# Patient Record
Sex: Female | Born: 1985 | Race: White | Hispanic: No | Marital: Single | Smoking: Current every day smoker
Health system: Southern US, Community
[De-identification: ages and names within clinical notes are randomized; demographics above are authoritative.]

## PROBLEM LIST (undated history)

## (undated) DIAGNOSIS — O1490 Unspecified pre-eclampsia, unspecified trimester: Secondary | ICD-10-CM

---
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
## 2005-10-27 ENCOUNTER — Emergency Department (HOSPITAL_COMMUNITY): Admission: EM | Admit: 2005-10-27 | Discharge: 2005-10-28 | Payer: Self-pay | Admitting: Emergency Medicine

## 2005-10-27 ENCOUNTER — Emergency Department (HOSPITAL_COMMUNITY): Admission: EM | Admit: 2005-10-27 | Discharge: 2005-10-27 | Payer: Self-pay | Admitting: Emergency Medicine

## 2006-02-04 ENCOUNTER — Emergency Department (HOSPITAL_COMMUNITY): Admission: EM | Admit: 2006-02-04 | Discharge: 2006-02-04 | Payer: Self-pay | Admitting: Emergency Medicine

## 2006-10-03 ENCOUNTER — Emergency Department (HOSPITAL_COMMUNITY): Admission: EM | Admit: 2006-10-03 | Discharge: 2006-10-03 | Payer: Self-pay | Admitting: Emergency Medicine

## 2007-03-31 ENCOUNTER — Emergency Department (HOSPITAL_COMMUNITY): Admission: EM | Admit: 2007-03-31 | Discharge: 2007-03-31 | Payer: Self-pay | Admitting: Family Medicine

## 2007-09-29 ENCOUNTER — Emergency Department (HOSPITAL_COMMUNITY): Admission: EM | Admit: 2007-09-29 | Discharge: 2007-09-30 | Payer: Self-pay | Admitting: Emergency Medicine

## 2007-11-16 ENCOUNTER — Inpatient Hospital Stay (HOSPITAL_COMMUNITY): Admission: AD | Admit: 2007-11-16 | Discharge: 2007-11-17 | Payer: Self-pay | Admitting: Obstetrics

## 2008-05-21 ENCOUNTER — Inpatient Hospital Stay (HOSPITAL_COMMUNITY): Admission: AD | Admit: 2008-05-21 | Discharge: 2008-05-23 | Payer: Self-pay | Admitting: Obstetrics

## 2008-07-30 ENCOUNTER — Emergency Department (HOSPITAL_COMMUNITY): Admission: EM | Admit: 2008-07-30 | Discharge: 2008-07-31 | Payer: Self-pay | Admitting: Emergency Medicine

## 2009-05-08 ENCOUNTER — Emergency Department (HOSPITAL_COMMUNITY): Admission: EM | Admit: 2009-05-08 | Discharge: 2009-05-08 | Payer: Self-pay | Admitting: Emergency Medicine

## 2010-02-09 ENCOUNTER — Emergency Department (HOSPITAL_COMMUNITY): Admission: EM | Admit: 2010-02-09 | Discharge: 2010-02-10 | Payer: Self-pay | Admitting: Emergency Medicine

## 2011-04-15 NOTE — Discharge Summary (Signed)
NAME:  Lacey Ford, Lacey Ford                          ACCOUNT NO.:  [id number]   MEDICAL RECORD NO.:  [id number]                   PATIENT TYPE:  INP   LOCATION:  9148                                 FACILITY:  [facility]   PHYSICIAN:  Gennie Alma, CNM                DATE OF BIRTH:  1986/04/23   DATE OF ADMISSION:  09/25/2003  DATE OF DISCHARGE:  09/29/2003                                 DISCHARGE SUMMARY   HISTORY OF PRESENT ILLNESS:  The patient is a 25 year old gravida 3, para 0-  0-2-0 when she presented and after discharge she was a gravida 3, para 1-0-2-  1.  She presented at 39 weeks for induction of labor due to size less than  dates and ultrasound showing IUGR.  She was admitted for Pitocin induction.  She had three postpartum hospital days.  She delivered a viable female by  vacuum extraction for persistent second stage variable decelerations.  Baby  had a compound hand presentation and nuchal cord x1, body cord x1, leg cord  x1.  Apgars were 8/9.  She had no lacerations.  Her estimated blood loss was  less than 100 mL.  She had an uncomplicated postpartum stay.  She was  afebrile.  Fundus was firm.  She received Depo-Provera 150 mg IM on day of  discharge.  She was discharged on September 29, 2003 without any complications  or problems and was given her Depo-Provera before she left.  Her blood  pressures were normal throughout her hospital stay.  She had a slightly  elevated blood pressure prior to her induction.                                               Gennie Alma, CNM    ACC/MEDQ  D:  12/03/2003  T:  12/03/2003  Job:  [id number]

## 2011-05-07 ENCOUNTER — Emergency Department (HOSPITAL_COMMUNITY)
Admission: EM | Admit: 2011-05-07 | Discharge: 2011-05-08 | Disposition: A | Discharge status: Home / Self Care | Payer: Self-pay | Attending: Emergency Medicine | Admitting: Emergency Medicine

## 2011-05-07 DIAGNOSIS — H5710 Ocular pain, unspecified eye: Secondary | ICD-10-CM | POA: Insufficient documentation

## 2011-05-07 DIAGNOSIS — S0083XA Contusion of other part of head, initial encounter: Secondary | ICD-10-CM | POA: Insufficient documentation

## 2011-05-07 DIAGNOSIS — S0003XA Contusion of scalp, initial encounter: Secondary | ICD-10-CM | POA: Insufficient documentation

## 2011-05-07 DIAGNOSIS — S0990XA Unspecified injury of head, initial encounter: Secondary | ICD-10-CM | POA: Insufficient documentation

## 2011-05-07 DIAGNOSIS — R51 Headache: Secondary | ICD-10-CM | POA: Insufficient documentation

## 2011-05-07 DIAGNOSIS — F988 Other specified behavioral and emotional disorders with onset usually occurring in childhood and adolescence: Secondary | ICD-10-CM | POA: Insufficient documentation

## 2011-05-07 DIAGNOSIS — F319 Bipolar disorder, unspecified: Secondary | ICD-10-CM | POA: Insufficient documentation

## 2011-05-08 ENCOUNTER — Emergency Department (HOSPITAL_COMMUNITY): Payer: Self-pay

## 2011-08-25 LAB — CBC
HCT: 36.5
Hemoglobin: 11 — ABNORMAL LOW
MCHC: 35.2
MCV: 92.8
MCV: 94
Platelets: 183
RDW: 12.4
RDW: 12.9

## 2011-09-02 LAB — URINALYSIS, ROUTINE W REFLEX MICROSCOPIC
Glucose, UA: NEGATIVE
Ketones, ur: NEGATIVE
Ketones, ur: NEGATIVE
Nitrite: NEGATIVE
Nitrite: NEGATIVE
Protein, ur: NEGATIVE
Specific Gravity, Urine: 1.015
Specific Gravity, Urine: 1.025

## 2011-09-02 LAB — GC/CHLAMYDIA PROBE AMP, GENITAL: Chlamydia, DNA Probe: POSITIVE — AB

## 2011-09-02 LAB — URINE MICROSCOPIC-ADD ON

## 2011-09-06 LAB — POCT PREGNANCY, URINE
Operator id: 294511
Preg Test, Ur: POSITIVE

## 2011-09-06 LAB — STREP A DNA PROBE: Group A Strep Probe: NEGATIVE

## 2012-07-19 ENCOUNTER — Emergency Department (HOSPITAL_COMMUNITY)
Admission: EM | Admit: 2012-07-19 | Discharge: 2012-07-19 | Disposition: A | Discharge status: Home / Self Care | Payer: Self-pay | Attending: Emergency Medicine | Admitting: Emergency Medicine

## 2012-07-19 ENCOUNTER — Encounter (HOSPITAL_COMMUNITY): Payer: Self-pay | Admitting: Emergency Medicine

## 2012-07-19 DIAGNOSIS — K0889 Other specified disorders of teeth and supporting structures: Secondary | ICD-10-CM

## 2012-07-19 DIAGNOSIS — K089 Disorder of teeth and supporting structures, unspecified: Secondary | ICD-10-CM | POA: Insufficient documentation

## 2012-07-19 MED ORDER — OXYCODONE-ACETAMINOPHEN 5-325 MG PO TABS: 1.0000 | ORAL_TABLET | Freq: Four times a day (QID) | ORAL | Status: AC | PRN

## 2012-07-19 MED ORDER — PENICILLIN V POTASSIUM 500 MG PO TABS: 500.0000 mg | ORAL_TABLET | Freq: Three times a day (TID) | ORAL | Status: AC

## 2012-07-19 NOTE — ED Provider Notes (Signed)
History     CSN: [id number]  Arrival date & time 07/19/12  1220   First MD Initiated Contact with Patient 07/19/12 1259      Chief Complaint  Patient presents with  . Dental Pain    (Consider location/radiation/quality/duration/timing/severity/associated sxs/prior treatment) HPI Pt presents with pain in right lower premolar.  Felt pain approx 6 weeks ago and noted tooth to be decayed and broken, then felt better until 3-4 days ago pain recurred.  No fever.  No facial swelling.  No difficulty breathing or swallowing.  Took ibuprofen, but none today, states she did not get any pain relief from this.  Has not seen a dentist.  There are no other associated systemic symptoms, there are no other alleviating or modifying factors.   History reviewed. No pertinent past medical history.  History reviewed. No pertinent past surgical history.  History reviewed. No pertinent family history.  History  Substance Use Topics  . Smoking status: Not on file  . Smokeless tobacco: Not on file  . Alcohol Use: Not on file    OB History    Grav Para Term Preterm Abortions TAB SAB Ect Mult Living                  Review of Systems ROS reviewed and all otherwise negative except for mentioned in HPI  Allergies  Review of patient's allergies indicates no known allergies.  Home Medications   Current Outpatient Rx  Name Route Sig Dispense Refill  . GOODY HEADACHE PO Oral Take 1 packet by mouth daily as needed. For headache    . IBUPROFEN 200 MG PO TABS Oral Take 400 mg by mouth every 6 (six) hours as needed. For pain    . OXYCODONE-ACETAMINOPHEN 5-325 MG PO TABS Oral Take 1-2 tablets by mouth every 6 (six) hours as needed for pain. 15 tablet 0  . PENICILLIN V POTASSIUM 500 MG PO TABS Oral Take 1 tablet (500 mg total) by mouth 3 (three) times daily. 30 tablet 0    BP 128/86  Pulse 120  Temp 97.8 F (36.6 C) (Oral)  Resp 22  Wt 120 lb (54.432 kg)  SpO2 100% Vitals reviewed Physical  Exam Physical Examination: General appearance - alert, well appearing, and in no distress Mental status - alert, oriented to person, place, and time Eyes - no conjunctival injection Mouth - mucous membranes moist, pharynx normal without lesions, decay/erosion of right lower molar, no periapical or gingival abscess Neck - supple, no significant adenopathy Chest - clear to auscultation, no wheezes, rales or rhonchi, symmetric air entry Heart - normal rate, regular rhythm, normal S1, S2, no murmurs, rubs, clicks or gallops Abdomen - soft, nontender, nondistended, no masses or organomegaly Extremities - peripheral pulses normal, no pedal edema, no clubbing or cyanosis Skin - normal coloration and turgor, no rashes  ED Course  Procedures (including critical care time)  Labs Reviewed - No data to display No results found.   1. Pain, dental       MDM  Pt presents with c/o dental pain.  No evidence of abscess.  Pt started on antibiotics and pain medication.  Given information for dental followup.  Discharged with strict return precautions.  Pt agreeable with plan.        Ethelda Chick, MD 07/21/12 [phone]

## 2012-07-19 NOTE — ED Notes (Signed)
Has had lower right jaw pain x 6 months. Has not been seen for this. Last took ibuprofen 2 days ago but "it didn't help."

## 2014-05-06 ENCOUNTER — Emergency Department (HOSPITAL_COMMUNITY)
Admission: EM | Admit: 2014-05-06 | Discharge: 2014-05-06 | Disposition: A | Discharge status: Home / Self Care | Payer: Self-pay | Attending: Emergency Medicine | Admitting: Emergency Medicine

## 2014-05-06 ENCOUNTER — Encounter (HOSPITAL_COMMUNITY): Payer: Self-pay | Admitting: Emergency Medicine

## 2014-05-06 DIAGNOSIS — K089 Disorder of teeth and supporting structures, unspecified: Secondary | ICD-10-CM | POA: Insufficient documentation

## 2014-05-06 DIAGNOSIS — K029 Dental caries, unspecified: Secondary | ICD-10-CM | POA: Insufficient documentation

## 2014-05-06 DIAGNOSIS — F17200 Nicotine dependence, unspecified, uncomplicated: Secondary | ICD-10-CM | POA: Insufficient documentation

## 2014-05-06 DIAGNOSIS — Z792 Long term (current) use of antibiotics: Secondary | ICD-10-CM | POA: Insufficient documentation

## 2014-05-06 DIAGNOSIS — K0889 Other specified disorders of teeth and supporting structures: Secondary | ICD-10-CM

## 2014-05-06 MED ORDER — HYDROCODONE-ACETAMINOPHEN 5-325 MG PO TABS: 1.0000 | ORAL_TABLET | Freq: Four times a day (QID) | ORAL | Status: DC | PRN

## 2014-05-06 MED ORDER — PENICILLIN V POTASSIUM 500 MG PO TABS: 500.0000 mg | ORAL_TABLET | Freq: Four times a day (QID) | ORAL | Status: DC

## 2014-05-06 NOTE — ED Notes (Signed)
Patient reports she had [employer] roast beef sandwich, fries, and mozzarella sticks 4 hours ago.

## 2014-05-06 NOTE — ED Provider Notes (Signed)
Medical screening examination/treatment/procedure(s) were performed by non-physician practitioner and as supervising physician I was immediately available for consultation/collaboration.   EKG Interpretation None       Doug Sou, MD 05/06/14 [phone]

## 2014-05-06 NOTE — ED Provider Notes (Signed)
CSN: [id number]     Arrival date & time 05/06/14  0204 History   First MD Initiated Contact with Patient 05/06/14 0229     Chief Complaint  Patient presents with  . Dental Pain     (Consider location/radiation/quality/duration/timing/severity/associated sxs/prior Treatment) Patient is a 28 y.o. female presenting with tooth pain. The history is provided by the patient. No language interpreter was used.  Dental Pain Location:  Upper Upper teeth location:  1/RU 3rd molar Quality:  Throbbing and aching Severity:  Moderate Onset quality:  Gradual Duration:  1 week Timing:  Constant Progression:  Worsening Chronicity:  Recurrent Context: dental caries and poor dentition   Context: not trauma   Relieved by:  Nothing Ineffective treatments: BC powders and tylenol. Associated symptoms: no difficulty swallowing, no drooling, no fever, no neck pain, no neck swelling, no oral bleeding, no oral lesions and no trismus   Risk factors: lack of dental care, periodontal disease and smoking     History reviewed. No pertinent past medical history. History reviewed. No pertinent past surgical history. No family history on file. History  Substance Use Topics  . Smoking status: Current Every Day Smoker -- 0.50 packs/day    Types: Cigarettes  . Smokeless tobacco: Not on file  . Alcohol Use: Yes     Comment: Occasional   OB History   Grav Para Term Preterm Abortions TAB SAB Ect Mult Living                  Review of Systems  Constitutional: Negative for fever.  HENT: Positive for dental problem. Negative for drooling, mouth sores and trouble swallowing.   Respiratory: Negative for shortness of breath.   Musculoskeletal: Negative for neck pain.  All other systems reviewed and are negative.    Allergies  Review of patient's allergies indicates no known allergies.  Home Medications   Prior to Admission medications   Medication Sig Start Date End Date Taking? Authorizing Provider    Aspirin-Caffeine 845-65 MG PACK Take 1 Package by mouth every 6 (six) hours as needed (for pain).   Yes Historical Provider, MD  ibuprofen (ADVIL,MOTRIN) 200 MG tablet Take 800 mg by mouth every 6 (six) hours as needed for moderate pain. For pain   Yes Historical Provider, MD  HYDROcodone-acetaminophen (NORCO/VICODIN) 5-325 MG per tablet Take 1-2 tablets by mouth every 6 (six) hours as needed for moderate pain or severe pain. 05/06/14   Antony Madura, PA-C  penicillin v potassium (VEETID) 500 MG tablet Take 1 tablet (500 mg total) by mouth 4 (four) times daily. 05/06/14   Antony Madura, PA-C   BP 114/62  Pulse 64  Temp(Src) 98.1 F (36.7 C) (Oral)  Resp 18  Ht 5\' 5"  (1.651 m)  Wt 120 lb (54.432 kg)  BMI 19.97 kg/m2  SpO2 97%  LMP 05/04/2014  Physical Exam  Nursing note and vitals reviewed. Constitutional: She is oriented to person, place, and time. She appears well-developed and well-nourished. No distress.  Nontoxic/nonseptic appearing  HENT:  Head: Normocephalic and atraumatic.  Mouth/Throat: Uvula is midline, oropharynx is clear and moist and mucous membranes are normal. No trismus in the jaw. Abnormal dentition. Dental caries present. No uvula swelling.    Uvula midline. Patient tolerating secretions without difficulty.  Eyes: Conjunctivae and EOM are normal. No scleral icterus.  Neck: Normal range of motion.  Neck supple. No stridor.  Pulmonary/Chest: Effort normal. No respiratory distress.  Musculoskeletal: Normal range of motion.  Neurological: She is alert and  oriented to person, place, and time.  Skin: Skin is warm and dry. No rash noted. She is not diaphoretic. No erythema. No pallor.  Psychiatric: She has a normal mood and affect. Her behavior is normal.    ED Course  Dental Date/Time: 05/06/2014 3:25 AM Performed by: Antony MaduraHUMES, Karis Rilling Authorized by: Antony MaduraHUMES, Loree Shehata Consent: Verbal consent obtained. written consent not obtained. The procedure was performed in an emergent  situation. Risks and benefits: risks, benefits and alternatives were discussed Consent given by: patient Patient understanding: patient states understanding of the procedure being performed Patient consent: the patient's understanding of the procedure matches consent given Procedure consent: procedure consent matches procedure scheduled Relevant documents: relevant documents present and verified Test results: test results available and properly labeled Site marked: the operative site was marked Imaging studies: imaging studies available Required items: required blood products, implants, devices, and special equipment available Patient identity confirmed: verbally with patient and arm band Time out: Immediately prior to procedure a "time out" was called to verify the correct patient, procedure, equipment, support staff and site/side marked as required. Preparation: Patient was prepped and draped in the usual sterile fashion. Local anesthesia used: yes Anesthesia: local infiltration Local anesthetic: bupivacaine 0.5% with epinephrine Anesthetic total: 1.8 ml Patient sedated: no Patient tolerance: Patient tolerated the procedure well with no immediate complications.   (including critical care time) Labs Review Labs Reviewed - No data to display  Imaging Review No results found.   EKG Interpretation None      MDM   Final diagnoses:  Dentalgia    Patient with toothache x 1 week. No gross abscess. Exam not concerning for Ludwig's angina or spread of infection. Will treat with penicillin and pain medicine. Urged patient to follow-up with dentist. Referral and resource guide provided. Patient agreeable to plan with no unaddressed concerns.   Filed Vitals:   05/06/14 0210 05/06/14 0229 05/06/14 0245 05/06/14 0315  BP: 128/82 128/82 114/62 138/71  Pulse: 79 71 64 75  Temp: 98.1 F (36.7 C)     TempSrc: Oral     Resp: 16 18    Height: 5\' 5"  (1.651 m)     Weight: 120 lb (54.432  kg)     SpO2: 98% 98% 97% 99%        Antony MaduraKelly Vernette Moise, PA-C 05/06/14 [phone]

## 2014-05-06 NOTE — ED Notes (Signed)
Preparing patient for discharge. 

## 2014-05-06 NOTE — ED Notes (Signed)
Patient here with complaint of upper right jaw pain starting about a week ago. Patient states that pain was initially intermittent but has become constant and more diffuse at this time.

## 2014-05-06 NOTE — ED Notes (Signed)
Dental box at the bedside. 

## 2014-05-06 NOTE — ED Notes (Signed)
Patient reports she has taken [facility] powders and motrin for pain management with no relief.

## 2014-05-06 NOTE — ED Notes (Signed)
Kelly, PA-C, at the bedside.  

## 2014-05-06 NOTE — Discharge Instructions (Signed)
Dental Pain °A tooth ache may be caused by cavities (tooth decay). Cavities expose the nerve of the tooth to air and hot or cold temperatures. It may come from an infection or abscess (also called a boil or furuncle) around your tooth. It is also often caused by dental caries (tooth decay). This causes the pain you are having. °DIAGNOSIS  °Your caregiver can diagnose this problem by exam. °TREATMENT  °· If caused by an infection, it may be treated with medications which kill germs (antibiotics) and pain medications as prescribed by your caregiver. Take medications as directed. °· Only take over-the-counter or prescription medicines for pain, discomfort, or fever as directed by your caregiver. °· Whether the tooth ache today is caused by infection or dental disease, you should see your dentist as soon as possible for further care. °SEEK MEDICAL CARE IF: °The exam and treatment you received today has been provided on an emergency basis only. This is not a substitute for complete medical or dental care. If your problem worsens or new problems (symptoms) appear, and you are unable to meet with your dentist, call or return to this location. °SEEK IMMEDIATE MEDICAL CARE IF:  °· You have a fever. °· You develop redness and swelling of your face, jaw, or neck. °· You are unable to open your mouth. °· You have severe pain uncontrolled by pain medicine. °MAKE SURE YOU:  °· Understand these instructions. °· Will watch your condition. °· Will get help right away if you are not doing well or get worse. °Document Released: 11/14/2005 Document Revised: 02/06/2012 Document Reviewed: 07/02/2008 °ExitCare® Patient Information ©2014 [employer]. °  Emergency Department Resource Guide °1) Find a Doctor and Pay Out of Pocket °Although you won't have to find out who is covered by your insurance plan, it is a good idea to ask around and get recommendations. You will then need to call the office and see if the doctor you have chosen will  accept you as a new patient and what types of options they offer for patients who are self-pay. Some doctors offer discounts or will set up payment plans for their patients who do not have insurance, but you will need to ask so you aren't surprised when you get to your appointment. ° °2) Contact Your Local Health Department °Not all health departments have doctors that can see patients for sick visits, but many do, so it is worth a call to see if yours does. If you don't know where your local health department is, you can check in your phone book. The CDC also has a tool to help you locate your state's health department, and many state websites also have listings of all of their local health departments. ° °3) Find a Walk-in Clinic °If your illness is not likely to be very severe or complicated, you may want to try a walk in clinic. These are popping up all over the country in pharmacies, drugstores, and shopping centers. They're usually staffed by nurse practitioners or physician assistants that have been trained to treat common illnesses and complaints. They're usually fairly quick and inexpensive. However, if you have serious medical issues or chronic medical problems, these are probably not your best option. ° °No Primary Care Doctor: °- Call [facility] at  832-8000 - they can help you locate a primary care doctor that  accepts your insurance, provides certain services, etc. °- Physician Referral Service- 1-800-533-3463 ° °Chronic Pain Problems: °Organization           Address  Phone   Notes  °[facility]  (336) 297-2271 Patients need to be referred by their primary care doctor.  ° °Medication Assistance: °Organization         Address  Phone   Notes  °[place] [facility] [address] °[place], [place] [address] (336) 641-8030 --Must be a resident of [place] °-- Must have NO insurance coverage whatsoever (no Medicaid/ Medicare, etc.) °-- The pt.  MUST have a primary care doctor that directs their care regularly and follows them in the community °  °[facility]  (866) 331-1348   °[employer]  (888) 892-1162   ° °Agencies that provide inexpensive medical care: °Organization         Address  Phone   Notes  °[facility]  (336) 832-8035   °[facility]    (336) 832-7272   °[facility] [address] °[place], [place] [address] (336) 832-4777   °[facility] [address], °[place] (336) 271-4999   °[facility]    (336) 373-0678   °[facility]    (336) 272-1050   °[facility] ° [address], [place] Phone:  (336) 832-4444, Fax:  (336) 832-4440 Hours of Operation:  9 am - 6 pm, M-F.  Also accepts Medicaid/Medicare and self-pay.  °[facility] ° [address], [place] Phone: (336) 832-3150, Fax: (336) 832-3151. Hours of Operation:  8:30 am - 5:30 pm, M-F.  Also accepts Medicaid and self-pay.  °[facility] [place] [address], [place] Phone: (336) 878-6027   °[facility] [address], [place], [place] (336)723-1848, Ext. 123 Mondays & Thursdays: 7-9 AM.  First 15 patients are seen on a first come, first serve basis. °  ° °Medicaid-accepting [place] Providers: ° °Organization         Address  Phone   Notes  °[facility] [address], [place] (336) 641-2100 Also accepts self-pay patients.  °[facility] [address], [place] ° (336) 856-9996   °[facility] [address], [place] (336) 288-8857   °[facility] [address] [place] [address], [place] (336) 299-7000   °Veita Bland [address], [place]  ° (336) 373-1557 Only accepts [place] Access Medicaid patients after they have their name applied to their card.  ° °Self-Pay (no insurance) in  [place]: ° °Organization         Address  Phone   Notes  °Sickle Cell Patients, [facility] [address], [place] (336) 832-1970   °[facility] [address], [place] (336) 832-4400   °[facility] ° [address], [place] (336) 992-4800   °[facility]/Dr. Osei-Bonsu ° [address] [place] [address], [place] or [address], [place] (336) 841-8500 Phone number for both [place] and [place] locations is the same.  °[facility] [address], [place] (336) 299-0000   °[facility] [address] [place] [address], [place] or [address] (336) 852-7530 °(336) 878-2260   °[facility] [address], [place] (336) 350-1642, phone; (336) 294-5005, fax Sees patients 1st and 3rd Saturday of every month.  Must not qualify   for public or private insurance (i.e. Medicaid, Medicare, [place] Health Choice, Veterans' Benefits) • Household income should be no more than 200% of the poverty level •The clinic cannot treat you if you are pregnant or think you are pregnant • Sexually transmitted diseases are not treated at the clinic.  ° °Dental Care: °Organization         Address  Phone  Notes  °[facility] [address], [place] (336) 641-6152 Accepts children up to age 21 who are enrolled in Medicaid or [place] Health Choice; pregnant women with a Medicaid card; and children who have applied for Medicaid or [place] Health Choice, but were declined, whose parents can pay a reduced fee at time of service.  °[facility] [place]  [address], [place] (336) 641-7733 Accepts children up to age 21 who are enrolled in Medicaid or [place] Health Choice; pregnant women with a Medicaid card; and children who have applied for Medicaid or [place] Health Choice, but were declined, whose parents  can pay a reduced fee at time of service.  °[facility] ° [address], [place] (336) 641-4533 Patients are seen by appointment only. Walk-ins are not accepted. [facility] will see patients 18 years of age and older. °Monday - Tuesday (8am-5pm) °Most Wednesdays (8:30-5pm) °$30 per visit, cash only  °[facility] ° [address], [place] (336) 641-4533 Patients are seen by appointment only. Walk-ins are not accepted. [facility] will see patients 18 years of age and older. °One Wednesday Evening (Monthly: Volunteer Based).  $30 per visit, cash only  °[facility]  (919) 537-3737 for adults; Children under age 4, call Graduate Pediatric Dentistry at (919) [phone]. Children aged 4-14, please call (919) [phone] to request a pediatric application. ° Dental services are provided in all areas of dental care including fillings, crowns and bridges, complete and partial dentures, implants, gum treatment, root canals, and extractions. Preventive care is also provided. Treatment is provided to both adults and children. °Patients are selected via a lottery and there is often a waiting list. °  °[facility] [address], °[place] ° (336) 763-8833 [email or web address] °  °[facility] [address], [place], [place] (336)723-1848, Ext. 123 Second and Fourth Thursday of each month, opens at 6:30 AM; Clinic ends at 9 AM.  Patients are seen on a first-come first-served basis, and a limited number are seen during each clinic.  ° °[facility] ° [address], [place], [place] (336) 723-7904   Eligibility Requirements °You must have lived in [place], [place], or [place] counties for at least the last three months. °  You cannot be eligible for state or federal sponsored healthcare insurance, including Veterans Administration, Medicaid, or Medicare. °  You generally cannot be eligible for healthcare  insurance through your employer.  °  How to apply: °Eligibility screenings are held every Tuesday and Wednesday afternoon from 1:00 pm until 4:00 pm. You do not need an appointment for the interview!  °[facility] [address], [place], [place] 336-631-2330   °[facility]  336-342-8273   °[facility]  336-703-3100   °[facility]  336-570-6415   ° °Behavioral Health Resources in the Community: °Intensive Outpatient Programs °Organization         Address  Phone    Notes  °[facility] [address], [place], [place] 336-878-6098   °[facility] [address], [place], [place] 336-832-9800   °[facility] [address], [place], [place] ° 336-882-2125   °[facility] [address],  °[place], [place] 1-800-853-5163 or 336-641-4981   °Substance Abuse Resources °Organization         Address  Phone  Notes  °[facility]  336-882-2125   °[facility]  336-784-9470   °[facility]  336-285-9073   °[facility]  336-845-3988   °Residential & Outpatient Substance Abuse Program  1-800-659-3381   °Psychological Services °Organization         Address  Phone  Notes  °[facility]  336- 832-9600   °[facility]  336- 378-7881   °[facility] [address], [place] 1-800-853-5163 or 336-641-4981   ° °Mobile Crisis Teams °Organization         Address  Phone  Notes  °[facility], Mobile Crisis Care Unit  1-877-626-1772   °Assertive °[facility] ° [address] [place], [place] 336-834-9664   °Sharon DeEsch [address] °[place] 336-554-5454   ° °Self-Help/Support Groups °Organization         Address  Phone             Notes  °[facility] - variety of support groups  336- 373-1402 Call for more information  °Narcotics Anonymous (NA),  [facility] [address], °[place]  2 meetings at this location  ° °Residential Treatment Programs °Organization         Address  Phone  Notes  °[facility] [address],    °[place]  1-866-801-8205   °[facility] ° [address], [place], [place] 704-293-8524   °[facility] [address], [place] 336-845-3988 Admissions: 8am-3pm M-F  °[facility] [address]    °[place], [place] 336-841-1104   °[facility] [address], [place], [place] 336-379-7146   °[facility] [address]  °[place], [place] 336-285-9073   °[facility] Intensive Outpatient [address], [place], [place] 336-852-3033   °[facility] [address]  °[place], [place] 1-877-615-2722 or 336-784-9470   °[facility] [address] [place], [place] 336-227-7417 Accepts Medicaid  °[facility] [address]  °[place] 1-800-659-3381 Substance Abuse/Addiction Treatment  ° °[place] Behavioral Health Resources °Organization         Address  Phone  Notes  °[facility]  (888) 581-9988   °Julie Brannon, PhD [address] [place], [place]   (336) 349-5553 or (336) 951-0000   °[facility]   [address] °[place], [place] (336) 349-4454   °[facility] [address], [place], [place] (336) 342-8316 Insurance/Medicaid/sponsorship through [facility]  °[facility] [address]                                    [place],  (336) 342-8316 Therapy/tele-psych/case  °[facility] [address]  ° [place],  (336) 349-2233    °Dr. Arfeen  (336) 349-4544   °[facility] [place]  [employer] [place]   [facility] 1) [address], [place] °2) [address], [place] °3)  [address], [place] (336) 349-3220 °(336) 342-7768 ° °(336) 342-8140     °[place] [facility] (336) 342-1394 or (336) 342-3537 (After Hours)    ° °   °

## 2015-03-19 ENCOUNTER — Emergency Department (HOSPITAL_COMMUNITY): Payer: Self-pay

## 2015-03-19 ENCOUNTER — Encounter (HOSPITAL_COMMUNITY): Payer: Self-pay | Admitting: *Deleted

## 2015-03-19 ENCOUNTER — Emergency Department (HOSPITAL_COMMUNITY)
Admission: EM | Admit: 2015-03-19 | Discharge: 2015-03-20 | Disposition: A | Discharge status: Home / Self Care | Payer: Self-pay | Attending: Emergency Medicine | Admitting: Emergency Medicine

## 2015-03-19 DIAGNOSIS — Y929 Unspecified place or not applicable: Secondary | ICD-10-CM | POA: Insufficient documentation

## 2015-03-19 DIAGNOSIS — Z79899 Other long term (current) drug therapy: Secondary | ICD-10-CM | POA: Insufficient documentation

## 2015-03-19 DIAGNOSIS — S0083XA Contusion of other part of head, initial encounter: Secondary | ICD-10-CM | POA: Insufficient documentation

## 2015-03-19 DIAGNOSIS — S0993XA Unspecified injury of face, initial encounter: Secondary | ICD-10-CM

## 2015-03-19 DIAGNOSIS — Y939 Activity, unspecified: Secondary | ICD-10-CM | POA: Insufficient documentation

## 2015-03-19 DIAGNOSIS — W503XXA Accidental bite by another person, initial encounter: Secondary | ICD-10-CM | POA: Insufficient documentation

## 2015-03-19 DIAGNOSIS — Y999 Unspecified external cause status: Secondary | ICD-10-CM | POA: Insufficient documentation

## 2015-03-19 DIAGNOSIS — Z720 Tobacco use: Secondary | ICD-10-CM | POA: Insufficient documentation

## 2015-03-19 MED ORDER — OXYCODONE-ACETAMINOPHEN 5-325 MG PO TABS
2.0000 | ORAL_TABLET | Freq: Once | ORAL | Status: AC
  Administered 2015-03-19: 2 via ORAL
  Filled 2015-03-19: qty 2

## 2015-03-19 MED ORDER — HYDROCODONE-ACETAMINOPHEN 5-325 MG PO TABS: 1.0000 | ORAL_TABLET | Freq: Four times a day (QID) | ORAL | Status: DC | PRN
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
## 2015-03-19 NOTE — Discharge Instructions (Signed)
1. Medications: vicodin, usual home medications 2. Treatment: rest, drink plenty of fluids, ice, soft foods 3. Follow Up: Please followup with your primary doctor in 3 days for discussion of your diagnoses and further evaluation after today's visit; if you do not have a primary care doctor use the resource guide provided to find one; Please return to the ER for worsening symptoms    Contusion A contusion is a deep bruise. Contusions are the result of an injury that caused bleeding under the skin. The contusion may turn blue, purple, or yellow. Minor injuries will give you a painless contusion, but more severe contusions may stay painful and swollen for a few weeks.  CAUSES  A contusion is usually caused by a blow, trauma, or direct force to an area of the body. SYMPTOMS   Swelling and redness of the injured area.  Bruising of the injured area.  Tenderness and soreness of the injured area.  Pain. DIAGNOSIS  The diagnosis can be made by taking a history and physical exam. An X-ray, CT scan, or MRI may be needed to determine if there were any associated injuries, such as fractures. TREATMENT  Specific treatment will depend on what area of the body was injured. In general, the best treatment for a contusion is resting, icing, elevating, and applying cold compresses to the injured area. Over-the-counter medicines may also be recommended for pain control. Ask your caregiver what the best treatment is for your contusion. HOME CARE INSTRUCTIONS   Put ice on the injured area.  Put ice in a plastic bag.  Place a towel between your skin and the bag.  Leave the ice on for 15-20 minutes, 3-4 times a day, or as directed by your health care provider.  Only take over-the-counter or prescription medicines for pain, discomfort, or fever as directed by your caregiver. Your caregiver may recommend avoiding anti-inflammatory medicines (aspirin, ibuprofen, and naproxen) for 48 hours because these  medicines may increase bruising.  Rest the injured area.  If possible, elevate the injured area to reduce swelling. SEEK IMMEDIATE MEDICAL CARE IF:   You have increased bruising or swelling.  You have pain that is getting worse.  Your swelling or pain is not relieved with medicines. MAKE SURE YOU:   Understand these instructions.  Will watch your condition.  Will get help right away if you are not doing well or get worse. Document Released: 08/24/2005 Document Revised: 11/19/2013 Document Reviewed: 09/19/2011 [facility] Patient Information 2015 [place], [place]. This information is not intended to replace advice given to you by your health care provider. Make sure you discuss any questions you have with your health care provider.    Emergency Department Resource Guide 1) Find a Doctor and Pay Out of Pocket Although you won't have to find out who is covered by your insurance plan, it is a good idea to ask around and get recommendations. You will then need to call the office and see if the doctor you have chosen will accept you as a new patient and what types of options they offer for patients who are self-pay. Some doctors offer discounts or will set up payment plans for their patients who do not have insurance, but you will need to ask so you aren't surprised when you get to your appointment.  2) Contact Your Local Health Department Not all health departments have doctors that can see patients for sick visits, but many do, so it is worth a call to see if yours does. If  you don't know where your local health department is, you can check in your phone book. The CDC also has a tool to help you locate your state's health department, and many state websites also have listings of all of their local health departments.  3) Find a Walk-in Clinic If your illness is not likely to be very severe or complicated, you may want to try a walk in clinic. These are popping up all over the country in  pharmacies, drugstores, and shopping centers. They're usually staffed by nurse practitioners or physician assistants that have been trained to treat common illnesses and complaints. They're usually fairly quick and inexpensive. However, if you have serious medical issues or chronic medical problems, these are probably not your best option.  No Primary Care Doctor: - Call Health Connect at  [phone] - they can help you locate a primary care doctor that  accepts your insurance, provides certain services, etc. - Physician Referral Service- [phone]  Chronic Pain Problems: Organization         Address  Phone   Notes  [facility]  [phone] Patients need to be referred by their primary care doctor.   Medication Assistance: Organization         Address  Phone   Notes  [facility] Medication [facility] [address] [place], [place] [address] [phone] --Must be a resident of [facility] -- Must have NO insurance coverage whatsoever (no Medicaid/ Medicare, etc.) -- The pt. MUST have a primary care doctor that directs their care regularly and follows them in the community   [facility]ist  [phone]   [employer]  [phone]    Agencies that provide inexpensive medical care: Organization         Address  Phone   Notes  [facility]  [phone]   [facility]    [phone]   [facility] [address] [place], [place] [address] [phone]   [facility] [address], [place] [phone]   Planned Parenthood    [phone]   [facility]    [phone]   Community Health and [facility]  [address], [place] Phone:  [phone], Fax:  [phone] Hours of Operation:  9 am - 6 pm, M-F.  Also accepts Medicaid/Medicare and self-pay.  [facility]  [address],  [place] Phone: [phone], Fax: [phone]. Hours of Operation:  8:30 am - 5:30 pm, M-F.  Also accepts Medicaid and self-pay.  [facility] [place] [address] [place] Phone: [phone]   [facility] [address] Natasha Bence [place], [place] [phone], Ext. [phone] Mondays & Thursdays: 7-9 AM.  First 15 patients are seen on a first come, first serve basis.    Medicaid-accepting [facility] Providers:  Organization         Address  Phone   Notes  [facility] [address], [place] [phone] Also accepts self-pay patients.  [facility] [address] Laurell Josephs [place], [place]  [phone]   [facility] [address], [place] [phone]   [facility] [address] [place] [phone]   Renaye Rakers [address], [place]   [phone] Only accepts  [place] Access Medicaid patients after they have their name applied to their card.   Self-Pay (no insurance) in [facility]:  Organization         Address  Phone   Notes  Sickle Cell Patients, [facility] [address], [place] [phone]) [phone]   [facility] [address], [place] [phone]) [phone]   [facility]  [address], [place] [phone]) [phone]   [facility]/Dr. Osei-Bonsu  [address] [place] [address], [place] or [phone], [place] [phone] Phone number for both [place] and [place] locations is the same.  Urgent Medical and [facility] [address], [place] [phone]) [phone]   [facility] [place] [address] [place] [address], [place] or [address] [phone]) [phone]   [facility] [address], [place] [phone]) [phone], phone; [phone](336) [phone], fax Sees patients 1st and 3rd Saturday of every month.  Must not  qualify for public or private insurance (i.e. Medicaid, Medicare, [place] Health Choice, Veterans' Benefits)  Household income should be no more than 200% of the poverty level The clinic cannot treat you if you are pregnant or think you are pregnant  Sexually transmitted diseases are not treated at the clinic.    Dental Care: Organization         Address  Phone  Notes  [facility] [address], [place] [phone] Accepts children up to age 29 who are enrolled in [place] or [place] Health Choice; pregnant women with a Medicaid card; and children who have applied for Medicaid or [place] Health Choice, but were declined, whose parents can pay a reduced fee at time of service.  [facility] of [facility] [place]  [address], [place] [phone] Accepts children up to age 29 who are enrolled in [place] or [place] Health Choice; pregnant women with a Medicaid card; and children who have applied for Medicaid or [place] Health Choice, but were declined, whose parents can pay a reduced fee at time of service.  [facility]  [address], [place] [phone]) [phone] Patients are seen by appointment only. Walk-ins are not accepted. [facility] will see patients 29 years of age and older. Monday - Tuesday (8am-5pm) Most Wednesdays (8:30-5pm) $30 per visit, cash only  [facility]  [address], [facility] [phone]) [phone] Patients are seen by appointment only. Walk-ins are not accepted. [facility] will see patients 29 years of age and older. One Wednesday Evening (Monthly: Volunteer Based).  $30 per visit, cash only  [facility]  [phone] for adults; Children under age 204, call Graduate Pediatric Dentistry at [phone]) [phone]. Children aged 774-14, please call [phone] to request a pediatric application.  Dental services are provided  in all areas of dental care including fillings, crowns and bridges, complete and partial dentures, implants, gum treatment, root canals, and extractions. Preventive care is also provided. Treatment is provided to both adults and children. Patients are selected via a lottery and there is often a waiting list.   [facility] [address], [place]  [phone]) [phone] [email or web address]   [facility] [address], [place], [place] [phone], Ext. [phone] Second and Fourth Thursday of each month, opens at 6:30 AM; Clinic ends at  9 AM.  Patients are seen on a first-come first-served basis, and a limited number are seen during each clinic.   [facility]  [address] Ether Griffins [place], [place] [phone]   Eligibility Requirements You must have lived in [place], [place], or [place] counties for at least the last three months.   You cannot be eligible for state or federal sponsored [place], including [employer], [place], or [employer].   You generally cannot be eligible for healthcare insurance through your employer.    How to apply: Eligibility screenings are held every Tuesday and Wednesday afternoon from 1:00 pm until 4:00 pm. You do not need an appointment for the interview!  [facility] [address] [place], [place] [phone]   [facility] Health Department  [phone]   [facility] Health Department  [phone]   [employer] [employer] [facility] Health Department  [phone]    Behavioral Health Resources in the Community: Intensive Outpatient Programs Organization         Address  Phone  Notes  [facility] Services [address], [place], [place] [phone]   [facility] Outpatient [address] [place], [place] [phone]   [facility] [address] [place], [place]  [phone]   [facility] Mental Health [address],  [place], [place]  [phone] or [phone]   Substance Abuse Resources Organization         Address  Phone  Notes  [facility]  [phone]   [facility]  [phone]   [facility] [place]  [phone]   Floydene Flock  [phone]   Residential & Outpatient Substance Abuse Program  2020479639   Psychological Services Organization         Address  Phone  Notes  [facility] Health  [phone]   [facility]  [phone]   [facility] Mental Health [address] [place] [phone] or [phone]    Mobile Crisis Teams Organization         Address  Phone  Notes  [facility], Mobile Crisis Care Unit  [phone]   [facility]  [address] [place], [place] [phone]   Doristine Locks [address] [place] [phone]    Self-Help/Support Groups Organization         Address  Phone             Notes  [facility] [place] - variety of support groups  336- I7437963 Call for more information  Narcotics Anonymous (NA), [facility] [address], [employer] [place]  2 meetings at this location   Statistician         Address  Phone  Notes  [facility] [address],    [place]  [phone]   [facility]  [address], [place] [address], [place], [place] [phone]   [facility] [address] [place], [place] [phone] Admissions: 8am-3pm M-F  [facility] [address],    [place], [place] [phone]   [facility] [address] Starling Manns [place], [place] [phone]   [facility] [address]  [place], [place] [phone]   [facility] - Intensive Outpatient [address]ure[address], [place], [place] [phone]   [facility] (Addiction Recovery [facility] [address]  [place], [place] [phone] or  [phone]   [facility]  Services (RTS) [address] [place], [place] [phone] Accepts Medicaid  [facility] [address]  [place] [phone] Substance Abuse/Addiction Treatment   [facility] Behavioral Health Resources Organization         Address  Phone  Notes  [facility]  [phone]) [phone]   Angie FavaJulie Brannon, PhD [address], [address] [place], [place]   [phone](336) [phone] or [phone]) [phone]   [facility]   [address] [place], [place] [phone](336) [phone]   [facility] [address], [place], [place] [phone]) [phone] Insurance/Medicaid/sponsorship through [facility]  [facility] [address]                                    [place], [place] [phone]) [phone] Therapy/tele-psych/case  [facility] [address]   [place], [place] [phone]) [phone]    Dr. Lolly MustacheArfeen  [phone]) [phone]   [facility]  [employer] [facility] 1) [address], [place] 2) [address], [place] 3)  [address], [place] [phone](336) [phone]  [phone]) [phone]   [facility] Child Abuse Hotline 7865758240(336) [phone] or [phone]) [phone] (After Hours)
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
## 2016-02-19 ENCOUNTER — Emergency Department (HOSPITAL_COMMUNITY)
Admission: EM | Admit: 2016-02-19 | Discharge: 2016-02-19 | Disposition: A | Discharge status: Home / Self Care | Payer: Self-pay | Attending: Emergency Medicine | Admitting: Emergency Medicine

## 2016-02-19 ENCOUNTER — Encounter (HOSPITAL_COMMUNITY): Payer: Self-pay | Admitting: Emergency Medicine

## 2016-02-19 DIAGNOSIS — R51 Headache: Secondary | ICD-10-CM | POA: Insufficient documentation

## 2016-02-19 DIAGNOSIS — H7192 Unspecified cholesteatoma, left ear: Secondary | ICD-10-CM

## 2016-02-19 DIAGNOSIS — H7112 Cholesteatoma of tympanum, left ear: Secondary | ICD-10-CM | POA: Insufficient documentation

## 2016-02-19 DIAGNOSIS — Z7982 Long term (current) use of aspirin: Secondary | ICD-10-CM | POA: Insufficient documentation

## 2016-02-19 DIAGNOSIS — Z792 Long term (current) use of antibiotics: Secondary | ICD-10-CM | POA: Insufficient documentation

## 2016-02-19 DIAGNOSIS — R05 Cough: Secondary | ICD-10-CM | POA: Insufficient documentation

## 2016-02-19 DIAGNOSIS — F17210 Nicotine dependence, cigarettes, uncomplicated: Secondary | ICD-10-CM | POA: Insufficient documentation

## 2016-02-19 DIAGNOSIS — R112 Nausea with vomiting, unspecified: Secondary | ICD-10-CM | POA: Insufficient documentation

## 2016-02-19 MED ORDER — OFLOXACIN 0.3 % OT SOLN: 5.0000 [drp] | Freq: Two times a day (BID) | OTIC | Status: DC

## 2016-02-19 MED ORDER — OXYCODONE-ACETAMINOPHEN 5-325 MG PO TABS
1.0000 | ORAL_TABLET | Freq: Once | ORAL | Status: AC
  Administered 2016-02-19: 1 via ORAL
  Filled 2016-02-19: qty 1

## 2016-02-19 MED ORDER — ANTIPYRINE-BENZOCAINE 5.4-1.4 % OT SOLN: 3.0000 [drp] | OTIC | Status: DC | PRN

## 2016-02-19 NOTE — Discharge Instructions (Signed)
Please read and follow all provided instructions.  Your diagnoses today include:  1. Cholesteatoma, left    Tests performed today include:  Vital signs. See below for your results today.   Medications prescribed:   Take as prescribed   Home care instructions:  Follow any educational materials contained in this packet.  Follow-up instructions: Please follow-up with your ENT as soon as possible for further evaluation of symptoms and treatment. Call and make an appointment    Return instructions:   Please return to the Emergency Department if you do not get better, if you get worse, or new symptoms OR  - Fever (temperature greater than 101.98F)  - Bleeding that does not stop with holding pressure to the area    -Severe pain (please note that you may be more sore the day after your accident)  - Chest Pain  - Difficulty breathing  - Severe nausea or vomiting  - Inability to tolerate food and liquids  - Passing out  - Skin becoming red around your wounds  - Change in mental status (confusion or lethargy)  - New numbness or weakness     Please return if you have any other emergent concerns.  Additional Information:  Your vital signs today were: BP 114/85 mmHg   Pulse 98   Temp(Src) 97.7 F (36.5 C) (Oral)   Resp 22   Ht 5\' 5"  (1.651 m)   Wt 62.687 kg   BMI 23.00 kg/m2   SpO2 100%   LMP 02/01/2016 If your blood pressure (BP) was elevated above 135/85 this visit, please have this repeated by your doctor within one month. ---------------

## 2016-02-19 NOTE — ED Notes (Addendum)
Pt crying, stating  I've got to have something". Called pharmacy to inquire about Auralgan. Pharmacists states it has been taken off the market. Pt informed. Very upset.

## 2016-02-19 NOTE — ED Notes (Signed)
Pt states she was at a funeral yesterday, has been crying a lot. States she thinks that caused mucous to "clog her ear". Pt crying at this time. "Can ya'll put something in there to numb it". LEFT EAR.

## 2016-02-19 NOTE — ED Notes (Signed)
Patient states started hurting about 0500 this morning with ringing in her ear.   Patient states no injury, states doesn't feel like foreign object in ear.   Patient states the pain "is debilitating, I can't explain it".

## 2016-02-19 NOTE — ED Provider Notes (Signed)
CSN: [id number]     Arrival date & time 02/19/16  4098 History  By signing my name below, I, Lacey Ford, attest that this documentation has been prepared under the direction and in the presence of Lacey Pili, PA-C. Electronically Signed: Linus Ford, ED Scribe. 02/19/2016. 10:25 AM.   Chief Complaint  Patient presents with  . Otalgia   The history is provided by the patient. No language interpreter was used.   HPI Comments: Lacey Ford is a 30 y.o. female who presents to the Emergency Department with no pertinent PMHx complaining of left ear pain that began 5 am this morning. Notes no discharge/bleeding. Pt also reports cough, nausea, vomiting and HA. Pt states she sleeps on her left side when the pain woke her up from sleep. She also notes ringing in her ear but no loss of hearing. Pt used a friends antibiotic eardrop with no relief. Pt denies any fevers, abdominal pain, diarrhea or any other symptoms at this time.   History reviewed. No pertinent past medical history. History reviewed. No pertinent past surgical history. No family history on file. Social History  Substance Use Topics  . Smoking status: Current Every Day Smoker -- 1.00 packs/day    Types: Cigarettes  . Smokeless tobacco: None  . Alcohol Use: Yes     Comment: Occasional   OB History    No data available     Review of Systems A complete 10 system review of systems was obtained and all systems are negative except as noted in the HPI and PMH.   Allergies  Review of patient's allergies indicates no known allergies.  Home Medications   Prior to Admission medications   Medication Sig Start Date End Date Taking? Authorizing Provider  Aspirin-Caffeine 845-65 MG PACK Take 1 Package by mouth every 6 (six) hours as needed (for pain).    Historical Provider, MD  HYDROcodone-acetaminophen (NORCO/VICODIN) 5-325 MG per tablet Take 1-2 tablets by mouth every 6 (six) hours as needed for moderate pain or severe pain.  03/19/15   [name], PA-C  ibuprofen (ADVIL,MOTRIN) 200 MG tablet Take 800 mg by mouth every 6 (six) hours as needed for moderate pain. For pain    Historical Provider, MD  penicillin v potassium (VEETID) 500 MG tablet Take 1 tablet (500 mg total) by mouth 4 (four) times daily. 05/06/14   Antony Madura, PA-C   BP 114/85 mmHg  Pulse 98  Temp(Src) 97.7 F (36.5 C) (Oral)  Resp 22  Ht  (1.651 m)  Wt 138 lb 3.2 oz (62.687 kg)  BMI 23.00 kg/m2  SpO2 100%  LMP 02/01/2016   Physical Exam  Constitutional: She is oriented to person, place, and time. She appears well-developed and well-nourished.  HENT:  Head: Normocephalic and atraumatic.  Left Ear: Hearing normal. No lacerations. There is tenderness. No foreign bodies. Tympanic membrane is injected and erythematous.  Possible Cholesteatoma noted at [facility]. Injected TM with erythema noted. No drainage. No bleeding noted.   Eyes: EOM are normal. Pupils are equal, round, and reactive to light.  Neck: Normal range of motion. Neck supple.  Cardiovascular: Normal rate, regular rhythm and normal heart sounds.   Pulmonary/Chest: Effort normal.  Abdominal: Soft. She exhibits no distension.  Musculoskeletal: Normal range of motion.  Neurological: She is alert and oriented to person, place, and time.  Skin: Skin is warm and dry.  Psychiatric: She has a normal mood and affect.  Nursing note and vitals reviewed.  ED Course  Procedures   DIAGNOSTIC STUDIES: Oxygen Saturation is 100% on room air, normal by my interpretation.    COORDINATION OF CARE: 10:22 AM Discussed treatment plan with pt at bedside and pt agreed to plan.  Labs Review Labs Reviewed - No data to display  Imaging Review No results found. I have personally reviewed and evaluated these images and lab results as part of my medical decision-making.   EKG Interpretation None      MDM  I have reviewed the relevant previous healthcare records. I obtained HPI from  historian. Patient discussed with supervising physician  ED Course:  Assessment: Pt is a 29yF who presents with left ear pain since this morning. On exam, pt in NAD. Nontoxic/nonseptic appearing. VSS. Afebrile. Lungs CTA. Heart RRR. Left ear shows injected TM with possible cholesteatoma at [facility]. No drainage noted. No bleeding. Pt notes ringing in ears, but no decrease in hearing. Given analgesia in ED. Plan is to DC home with ABX otic drops and analgesia with follow up to ENT for further evaluation. Supervising physician agrees to ENT referral. At time of discharge, Patient is in no acute distress. Vital Signs are stable. Patient is able to ambulate. Patient able to tolerate PO.   Disposition/Plan:  DC Home Additional Verbal discharge instructions given and discussed with patient.  Pt Instructed to f/u with ENT in the next 48 hours for evaluation and treatment of symptoms. Return precautions given Pt acknowledges and agrees with plan  Supervising Physician Vanetta MuldersScott Zackowski, MD   Final diagnoses:  Cholesteatoma, left    I personally performed the services described in this documentation, which was scribed in my presence. The recorded information has been reviewed and is accurate.   Lacey Piliyler Azhar Knope, PA-C 02/19/16 1044  Vanetta MuldersScott Zackowski, MD 02/24/16 1005

## 2016-10-19 ENCOUNTER — Emergency Department (HOSPITAL_COMMUNITY)
Admission: EM | Admit: 2016-10-19 | Discharge: 2016-10-19 | Disposition: A | Discharge status: Home / Self Care | Payer: Self-pay | Attending: Physician Assistant | Admitting: Physician Assistant

## 2016-10-19 ENCOUNTER — Encounter (HOSPITAL_COMMUNITY): Payer: Self-pay | Admitting: Emergency Medicine

## 2016-10-19 DIAGNOSIS — Z7982 Long term (current) use of aspirin: Secondary | ICD-10-CM | POA: Insufficient documentation

## 2016-10-19 DIAGNOSIS — F17210 Nicotine dependence, cigarettes, uncomplicated: Secondary | ICD-10-CM | POA: Insufficient documentation

## 2016-10-19 DIAGNOSIS — K0889 Other specified disorders of teeth and supporting structures: Secondary | ICD-10-CM | POA: Insufficient documentation

## 2016-10-19 MED ORDER — NAPROXEN 250 MG PO TABS: 250.0000 mg | ORAL_TABLET | Freq: Two times a day (BID) | ORAL | 0 refills | Status: DC

## 2016-10-19 MED ORDER — ONDANSETRON 4 MG PO TBDP
4.0000 mg | ORAL_TABLET | Freq: Once | ORAL | Status: AC
  Administered 2016-10-19: 4 mg via ORAL
  Filled 2016-10-19: qty 1

## 2016-10-19 MED ORDER — OXYCODONE-ACETAMINOPHEN 5-325 MG PO TABS
1.0000 | ORAL_TABLET | Freq: Once | ORAL | Status: AC
  Administered 2016-10-19: 1 via ORAL
  Filled 2016-10-19: qty 1

## 2016-10-19 MED ORDER — PENICILLIN V POTASSIUM 500 MG PO TABS: 500.0000 mg | ORAL_TABLET | Freq: Four times a day (QID) | ORAL | 0 refills | Status: DC

## 2016-10-19 NOTE — ED Notes (Signed)
Pt dropped first zofran on floor

## 2016-10-19 NOTE — ED Notes (Signed)
Pt states she wanted pa to numb her tooth, states  They did this laast time, she states these teeth that are broken have been broken for a while but they started to hurt yesterday

## 2016-10-19 NOTE — ED Notes (Signed)
States pain is making her nauseous

## 2016-10-19 NOTE — ED Provider Notes (Signed)
[facility]-EMERGENCY DEPT Provider Note   CSN: [id number] Arrival date & time: 10/19/16  [id number]     History   Chief Complaint Chief Complaint  Patient presents with  . Dental Pain    HPI Lacey CouncilmanJessica R Parcell is a 30 y.o. female.  Lacey Ford is a 30 y.o. Female who presents to the ED complaining of left upper and lower throbbing dental pain since yesterday. She reports her pain radiates up into her left ear. She reports taking BC powders with little relief today. She reports the pain is making her nauseated. She does not have a dental provider. She denies fevers, ear discharge, mouth discharge, neck pain, neck stiffness, sore throat, trouble swallowing, vomiting, abdominal pain, chest pain or shortness of breath.    The history is provided by the patient. No language interpreter was used.  Dental Pain      History reviewed. No pertinent past medical history.  There are no active problems to display for this patient.   History reviewed. No pertinent surgical history.  OB History    No data available       Home Medications    Prior to Admission medications   Medication Sig Start Date End Date Taking? Authorizing Provider  antipyrine-benzocaine Lyla Son(AURALGAN) otic solution Place 3-4 drops into the left ear every 2 (two) hours as needed for ear pain. 02/19/16   Audry Piliyler Mohr, PA-C  Aspirin-Caffeine [phone] MG PACK Take 1 Package by mouth every 6 (six) hours as needed (for pain).    Historical Provider, MD  HYDROcodone-acetaminophen (NORCO/VICODIN) 5-325 MG per tablet Take 1-2 tablets by mouth every 6 (six) hours as needed for moderate pain or severe pain. 03/19/15   [name], PA-C  ibuprofen (ADVIL,MOTRIN) 200 MG tablet Take 800 mg by mouth every 6 (six) hours as needed for moderate pain. For pain    Historical Provider, MD  naproxen (NAPROSYN) 250 MG tablet Take 1 tablet (250 mg total) by mouth 2 (two) times daily with a meal. 10/19/16   Everlene FarrierWilliam Avary Eichenberger, PA-C  ofloxacin (FLOXIN)  0.3 % otic solution Place 5 drops into the left ear 2 (two) times daily. For 2 weeks 02/19/16   Audry Piliyler Mohr, PA-C  penicillin v potassium (VEETID) 500 MG tablet Take 1 tablet (500 mg total) by mouth 4 (four) times daily. 10/19/16   Everlene FarrierWilliam Divina Neale, PA-C    Family History No family history on file.  Social History Social History  Substance Use Topics  . Smoking status: Current Every Day Smoker    Packs/day: 1.00    Types: Cigarettes  . Smokeless tobacco: Never Used  . Alcohol use Yes     Comment: Occasional     Allergies   Patient has no known allergies.   Review of Systems Review of Systems  Constitutional: Negative for chills and fever.  HENT: Positive for dental problem. Negative for drooling, ear discharge, ear pain, facial swelling, sore throat and trouble swallowing.   Eyes: Negative for pain and visual disturbance.  Gastrointestinal: Positive for nausea. Negative for abdominal pain, diarrhea and vomiting.  Musculoskeletal: Negative for neck pain and neck stiffness.  Skin: Negative for rash.  Neurological: Negative for headaches.     Physical Exam Updated Vital Signs BP (!) 134/120 (BP Location: Right Arm)   Pulse (!) 121   Temp 98.6 F (37 C) (Oral)   Resp 24   Wt 63.5 kg   SpO2 100%   BMI 23.30 kg/m   Physical Exam  Constitutional: She appears well-developed  and well-nourished. No distress.  Non-toxic appearing.   HENT:  Head: Normocephalic and atraumatic.  Right Ear: External ear normal.  Left Ear: External ear normal.  Mouth/Throat: Oropharynx is clear and moist. No oropharyngeal exudate.  Tenderness to left upper and lower molars. Generally poor dentition.  No discharge from the mouth. No facial swelling.  Uvula is midline without edema. Soft palate rises symmetrically. No tonsillar hypertrophy or exudates. Tongue protrusion is normal. No trismus.  Bilateral tympanic membranes are pearly-gray without erythema or loss of landmarks.   Eyes: Conjunctivae  are normal. Pupils are equal, round, and reactive to light. Right eye exhibits no discharge. Left eye exhibits no discharge.  Neck: Normal range of motion. Neck supple. No JVD present. No tracheal deviation present.  Cardiovascular: Regular rhythm, normal heart sounds and intact distal pulses.   HR 108.   Pulmonary/Chest: Effort normal and breath sounds normal. No respiratory distress.  Lymphadenopathy:    She has no cervical adenopathy.  Neurological: Coordination normal.  Skin: Skin is warm and dry. Capillary refill takes less than 2 seconds. No rash noted. She is not diaphoretic. No erythema. No pallor.  Psychiatric: She has a normal mood and affect. Her behavior is normal.  Nursing note and vitals reviewed.    ED Treatments / Results  Labs (all labs ordered are listed, but only abnormal results are displayed) Labs Reviewed - No data to display  EKG  EKG Interpretation None       Radiology No results found.  Procedures Procedures (including critical care time)  Medications Ordered in ED Medications  ondansetron (ZOFRAN-ODT) disintegrating tablet 4 mg (4 mg Oral Given 10/19/16 0734)  oxyCODONE-acetaminophen (PERCOCET/ROXICET) 5-325 MG per tablet 1 tablet (1 tablet Oral Given 10/19/16 0734)     Initial Impression / Assessment and Plan / ED Course  I have reviewed the triage vital signs and the nursing notes.  Pertinent labs & imaging results that were available during my care of the patient were reviewed by me and considered in my medical decision making (see chart for details).  Clinical Course    This is a 30 y.o. Female who presents to the ED complaining of left upper and lower throbbing dental pain since yesterday. Patient with toothache.  No gross abscess.  Exam unconcerning for Ludwig's angina or spread of infection.  Will treat with penicillin and pain medicine.  Urged patient to follow-up with dentist.  Dental resource guide provided. I advised the patient to  follow-up with their primary care provider this week. I advised the patient to return to the emergency department with new or worsening symptoms or new concerns. The patient verbalized understanding and agreement with plan.    Final Clinical Impressions(s) / ED Diagnoses   Final diagnoses:  Pain, dental    New Prescriptions Discharge Medication List as of 10/19/2016  7:54 AM    START taking these medications   Details  naproxen (NAPROSYN) 250 MG tablet Take 1 tablet (250 mg total) by mouth 2 (two) times daily with a meal., Starting Wed 10/19/2016, Print         Everlene FarrierWilliam Arabelle Bollig, PA-C 10/19/16 [id number]    Courteney Randall AnLyn Mackuen, MD 10/19/16 1546

## 2016-10-19 NOTE — ED Triage Notes (Addendum)
Left sided jaw pain x 14 hours throbbing pain, has 2 broken teeth on that side

## 2018-11-28 NOTE — L&D Delivery Note (Signed)
   Delivery Note Pt was comfortable w/epidural for 5 hours.  Noted to be C/C/+3/ AROM clear fluid, 10 minute 2nd stageAt 12:31 AM a viable female was delivered via Vaginal, Spontaneous (Presentation: LOA  ).  APGAR: 8/9 ; weight  pending.  Mom did not want to see baby, so placed on my lap, dried and suctioned. After 1 minute, the cord was clamped and cut. 40 units of pitocin diluted in 1000cc LR was infused rapidly IV.  The placenta separated spontaneously and delivered via CCT and maternal pushing effort.  It was inspected and appears to be intact with a 3 VC.  Anesthesia:  epidrual Episiotomy:   Lacerations:  none Suture Repair:  Est. Blood Loss (mL):    Mom to postpartum.  Baby to NICU. Baby looks to be 35-36 weeks  Christin Fudge 05/31/2019, 12:51 AM

## 2019-05-30 ENCOUNTER — Inpatient Hospital Stay (HOSPITAL_COMMUNITY): Payer: Medicaid Other | Admitting: Anesthesiology

## 2019-05-30 ENCOUNTER — Other Ambulatory Visit: Payer: Self-pay

## 2019-05-30 ENCOUNTER — Inpatient Hospital Stay (HOSPITAL_COMMUNITY)
Admission: EM | Admit: 2019-05-30 | Discharge: 2019-06-01 | DRG: 807 | Disposition: A | Discharge status: Home / Self Care | Payer: Medicaid Other | Attending: Obstetrics & Gynecology | Admitting: Obstetrics & Gynecology

## 2019-05-30 ENCOUNTER — Encounter (HOSPITAL_COMMUNITY): Payer: Self-pay | Admitting: *Deleted

## 2019-05-30 ENCOUNTER — Inpatient Hospital Stay (HOSPITAL_COMMUNITY): Payer: Medicaid Other

## 2019-05-30 DIAGNOSIS — Z1159 Encounter for screening for other viral diseases: Secondary | ICD-10-CM

## 2019-05-30 DIAGNOSIS — Z3A00 Weeks of gestation of pregnancy not specified: Secondary | ICD-10-CM

## 2019-05-30 DIAGNOSIS — O1490 Unspecified pre-eclampsia, unspecified trimester: Secondary | ICD-10-CM | POA: Diagnosis present

## 2019-05-30 DIAGNOSIS — O99334 Smoking (tobacco) complicating childbirth: Secondary | ICD-10-CM | POA: Diagnosis present

## 2019-05-30 DIAGNOSIS — O139 Gestational [pregnancy-induced] hypertension without significant proteinuria, unspecified trimester: Secondary | ICD-10-CM | POA: Diagnosis present

## 2019-05-30 DIAGNOSIS — F17210 Nicotine dependence, cigarettes, uncomplicated: Secondary | ICD-10-CM | POA: Diagnosis present

## 2019-05-30 DIAGNOSIS — O1404 Mild to moderate pre-eclampsia, complicating childbirth: Principal | ICD-10-CM | POA: Diagnosis present

## 2019-05-30 DIAGNOSIS — Z8759 Personal history of other complications of pregnancy, childbirth and the puerperium: Secondary | ICD-10-CM | POA: Diagnosis present

## 2019-05-30 DIAGNOSIS — O99324 Drug use complicating childbirth: Secondary | ICD-10-CM | POA: Diagnosis not present

## 2019-05-30 DIAGNOSIS — R1084 Generalized abdominal pain: Secondary | ICD-10-CM

## 2019-05-30 DIAGNOSIS — R109 Unspecified abdominal pain: Secondary | ICD-10-CM | POA: Diagnosis present

## 2019-05-30 HISTORY — DX: Unspecified pre-eclampsia, unspecified trimester: O14.90

## 2019-05-30 LAB — SARS CORONAVIRUS 2 BY RT PCR (HOSPITAL ORDER, PERFORMED IN ~~LOC~~ HOSPITAL LAB): SARS Coronavirus 2: NEGATIVE

## 2019-05-30 LAB — RAPID URINE DRUG SCREEN, HOSP PERFORMED
Amphetamines: POSITIVE — AB
Barbiturates: NOT DETECTED
Benzodiazepines: NOT DETECTED
Cocaine: NOT DETECTED
Opiates: NOT DETECTED
Tetrahydrocannabinol: POSITIVE — AB

## 2019-05-30 LAB — CBC
HCT: 30.6 % — ABNORMAL LOW (ref 36.0–46.0)
Hemoglobin: 9.9 g/dL — ABNORMAL LOW (ref 12.0–15.0)
MCH: 25.9 pg — ABNORMAL LOW (ref 26.0–34.0)
MCHC: 32.4 g/dL (ref 30.0–36.0)
MCV: 80.1 fL (ref 80.0–100.0)
Platelets: 155 10*3/uL (ref 150–400)
RBC: 3.82 MIL/uL — ABNORMAL LOW (ref 3.87–5.11)
RDW: 15 % (ref 11.5–15.5)
WBC: 11.1 10*3/uL — ABNORMAL HIGH (ref 4.0–10.5)
nRBC: 0 % (ref 0.0–0.2)

## 2019-05-30 LAB — COMPREHENSIVE METABOLIC PANEL
ALT: 11 U/L (ref 0–44)
AST: 15 U/L (ref 15–41)
Albumin: 2.8 g/dL — ABNORMAL LOW (ref 3.5–5.0)
Alkaline Phosphatase: 180 U/L — ABNORMAL HIGH (ref 38–126)
Anion gap: 9 (ref 5–15)
BUN: <5 mg/dL — ABNORMAL LOW (ref 6–20)
CO2: 21 mmol/L — ABNORMAL LOW (ref 22–32)
Calcium: 8.6 mg/dL — ABNORMAL LOW (ref 8.9–10.3)
Chloride: 107 mmol/L (ref 98–111)
Creatinine, Ser: 0.49 mg/dL (ref 0.44–1.00)
GFR calc Af Amer: >60 mL/min (ref >60)
GFR calc non Af Amer: >60 mL/min (ref >60)
Glucose, Bld: 124 mg/dL — ABNORMAL HIGH (ref 70–99)
Potassium: 3 mmol/L — ABNORMAL LOW (ref 3.5–5.1)
Sodium: 137 mmol/L (ref 135–145)
Total Bilirubin: 0.7 mg/dL (ref 0.3–1.2)
Total Protein: 6.3 g/dL — ABNORMAL LOW (ref 6.5–8.1)

## 2019-05-30 LAB — TYPE AND SCREEN
ABO/RH(D): A POS
Antibody Screen: NEGATIVE

## 2019-05-30 LAB — PROTEIN / CREATININE RATIO, URINE
Creatinine, Urine: 69.17 mg/dL
Protein Creatinine Ratio: 0.45 mg/mg{Cre} — ABNORMAL HIGH (ref 0.00–0.15)
Total Protein, Urine: 31 mg/dL

## 2019-05-30 LAB — I-STAT BETA HCG BLOOD, ED (MC, WL, AP ONLY): I-stat hCG, quantitative: >2000 m[IU]/mL — ABNORMAL HIGH (ref <5)

## 2019-05-30 MED ORDER — SOD CITRATE-CITRIC ACID 500-334 MG/5ML PO SOLN: 30.0000 mL | ORAL | Status: DC | PRN

## 2019-05-30 MED ORDER — OXYTOCIN BOLUS FROM INFUSION
500.0000 mL | Freq: Once | INTRAVENOUS | Status: AC
  Administered 2019-05-31: 01:00:00 500 mL via INTRAVENOUS

## 2019-05-30 MED ORDER — OXYCODONE-ACETAMINOPHEN 5-325 MG PO TABS: 1.0000 | ORAL_TABLET | ORAL | Status: DC | PRN

## 2019-05-30 MED ORDER — LACTATED RINGERS IV SOLN
500.0000 mL | Freq: Once | INTRAVENOUS | Status: AC
  Administered 2019-05-30: 500 mL via INTRAVENOUS

## 2019-05-30 MED ORDER — EPHEDRINE 5 MG/ML INJ
10.0000 mg | INTRAVENOUS | Status: DC | PRN
  Filled 2019-05-30: qty 2

## 2019-05-30 MED ORDER — FENTANYL CITRATE (PF) 100 MCG/2ML IJ SOLN
100.0000 ug | Freq: Once | INTRAMUSCULAR | Status: AC
  Administered 2019-05-30: 19:00:00 100 ug via INTRAVENOUS

## 2019-05-30 MED ORDER — OXYCODONE-ACETAMINOPHEN 5-325 MG PO TABS: 2.0000 | ORAL_TABLET | ORAL | Status: DC | PRN

## 2019-05-30 MED ORDER — MORPHINE SULFATE (PF) 4 MG/ML IV SOLN: 4.0000 mg | Freq: Once | INTRAVENOUS | Status: DC

## 2019-05-30 MED ORDER — FENTANYL CITRATE (PF) 100 MCG/2ML IJ SOLN
INTRAMUSCULAR | Status: AC
  Filled 2019-05-30: qty 2

## 2019-05-30 MED ORDER — BETAMETHASONE SOD PHOS & ACET 6 (3-3) MG/ML IJ SUSP
12.0000 mg | INTRAMUSCULAR | Status: DC
  Administered 2019-05-30: 20:00:00 12 mg via INTRAMUSCULAR
  Filled 2019-05-30: qty 2

## 2019-05-30 MED ORDER — FENTANYL-BUPIVACAINE-NACL 0.5-0.125-0.9 MG/250ML-% EP SOLN
12.0000 mL/h | EPIDURAL | Status: DC | PRN
  Filled 2019-05-30: qty 250

## 2019-05-30 MED ORDER — ONDANSETRON HCL 4 MG/2ML IJ SOLN: 4.0000 mg | Freq: Four times a day (QID) | INTRAMUSCULAR | Status: DC | PRN

## 2019-05-30 MED ORDER — LACTATED RINGERS IV SOLN: 500.0000 mL | INTRAVENOUS | Status: DC | PRN

## 2019-05-30 MED ORDER — SODIUM CHLORIDE 0.9 % IV SOLN
2.0000 g | Freq: Once | INTRAVENOUS | Status: AC
  Administered 2019-05-30: 2 g via INTRAVENOUS
  Filled 2019-05-30: qty 2000

## 2019-05-30 MED ORDER — DIPHENHYDRAMINE HCL 50 MG/ML IJ SOLN
12.5000 mg | INTRAMUSCULAR | Status: DC | PRN
  Administered 2019-05-30: 23:00:00 12.5 mg via INTRAVENOUS
  Filled 2019-05-30: qty 1

## 2019-05-30 MED ORDER — LIDOCAINE HCL (PF) 1 % IJ SOLN: 30.0000 mL | INTRAMUSCULAR | Status: DC | PRN

## 2019-05-30 MED ORDER — OXYTOCIN 40 UNITS IN NORMAL SALINE INFUSION - SIMPLE MED: 2.5000 [IU]/h | INTRAVENOUS | Status: DC

## 2019-05-30 MED ORDER — ACETAMINOPHEN 325 MG PO TABS: 650.0000 mg | ORAL_TABLET | ORAL | Status: DC | PRN

## 2019-05-30 MED ORDER — OXYTOCIN 40 UNITS IN NORMAL SALINE INFUSION - SIMPLE MED
2.5000 [IU]/h | INTRAVENOUS | Status: DC
  Filled 2019-05-30: qty 1000

## 2019-05-30 MED ORDER — LACTATED RINGERS IV SOLN
INTRAVENOUS | Status: DC
  Administered 2019-05-30 (×2): via INTRAVENOUS

## 2019-05-30 MED ORDER — LIDOCAINE HCL (PF) 1 % IJ SOLN
INTRAMUSCULAR | Status: DC | PRN
  Administered 2019-05-30: 6 mL via EPIDURAL

## 2019-05-30 MED ORDER — PHENYLEPHRINE 40 MCG/ML (10ML) SYRINGE FOR IV PUSH (FOR BLOOD PRESSURE SUPPORT)
80.0000 ug | PREFILLED_SYRINGE | INTRAVENOUS | Status: DC | PRN
  Filled 2019-05-30 (×2): qty 10

## 2019-05-30 MED ORDER — LACTATED RINGERS IV SOLN: INTRAVENOUS | Status: DC

## 2019-05-30 MED ORDER — OXYTOCIN BOLUS FROM INFUSION: 500.0000 mL | Freq: Once | INTRAVENOUS | Status: DC

## 2019-05-30 MED ORDER — SODIUM CHLORIDE (PF) 0.9 % IJ SOLN
INTRAMUSCULAR | Status: DC | PRN
  Administered 2019-05-30: 12 mL/h via EPIDURAL

## 2019-05-30 MED ORDER — PHENYLEPHRINE 40 MCG/ML (10ML) SYRINGE FOR IV PUSH (FOR BLOOD PRESSURE SUPPORT)
80.0000 ug | PREFILLED_SYRINGE | INTRAVENOUS | Status: DC | PRN
  Filled 2019-05-30: qty 10

## 2019-05-30 MED ORDER — FLEET ENEMA 7-19 GM/118ML RE ENEM: 1.0000 | ENEMA | RECTAL | Status: DC | PRN

## 2019-05-30 NOTE — ED Provider Notes (Signed)
[facility] Provider Note   CSN: [id number] Arrival date & time: 05/30/19  1753     History   Chief Complaint No chief complaint on file.   HPI Lacey Ford is a 33 y.o. female who presents with abdominal pain. It started 6 hours ago. It feels like contractions. She doesn't know if she's pregnant or not. Her LMP was a month ago. She states that she was not able to fit in her jeans since last week.  LEVEL 5 caveat due to acuity     HPI  No past medical history on file.  There are no active problems to display for this patient.   No past surgical history on file.   OB History   No obstetric history on file.      Home Medications    Prior to Admission medications   Medication Sig Start Date End Date Taking? Authorizing Provider  antipyrine-benzocaine Lyla Son(AURALGAN) otic solution Place 3-4 drops into the left ear every 2 (two) hours as needed for ear pain. 02/19/16   Audry PiliMohr, Tyler, PA-C  Aspirin-Caffeine [phone] MG PACK Take 1 Package by mouth every 6 (six) hours as needed (for pain).    [provider]  HYDROcodone-acetaminophen (NORCO/VICODIN) 5-325 MG per tablet Take 1-2 tablets by mouth every 6 (six) hours as needed for moderate pain or severe pain. 03/19/15   Muthersbaugh, Dahlia ClientHannah, PA-C  ibuprofen (ADVIL,MOTRIN) 200 MG tablet Take 800 mg by mouth every 6 (six) hours as needed for moderate pain. For pain    [provider]  naproxen (NAPROSYN) 250 MG tablet Take 1 tablet (250 mg total) by mouth 2 (two) times daily with a meal. 10/19/16   Everlene Farrieransie, William, PA-C  ofloxacin (FLOXIN) 0.3 % otic solution Place 5 drops into the left ear 2 (two) times daily. For 2 weeks 02/19/16   Audry PiliMohr, Tyler, PA-C  penicillin v potassium (VEETID) 500 MG tablet Take 1 tablet (500 mg total) by mouth 4 (four) times daily. 10/19/16   Everlene Farrieransie, William, PA-C    Family History No family history on file.  Social History Social History   Tobacco Use   . Smoking status: Current Every Day Smoker    Packs/day: 1.00    Types: Cigarettes  . Smokeless tobacco: Never Used  Substance Use Topics  . Alcohol use: Yes    Comment: Occasional  . Drug use: No     Allergies   Patient has no known allergies.   Review of Systems Review of Systems  Unable to perform ROS: Acuity of condition     Physical Exam Updated Vital Signs There were no vitals taken for this visit.  Physical Exam Vitals signs and nursing note reviewed.  Constitutional:      General: She is in acute distress.     Appearance: She is well-developed.  HENT:     Head: Normocephalic and atraumatic.  Eyes:     General: No scleral icterus.       Right eye: No discharge.        Left eye: No discharge.     Conjunctiva/sclera: Conjunctivae normal.     Pupils: Pupils are equal, round, and reactive to light.  Neck:     Musculoskeletal: Normal range of motion.  Cardiovascular:     Rate and Rhythm: Normal rate.  Pulmonary:     Effort: Pulmonary effort is normal. No respiratory distress.  Abdominal:     General: There is distension.     Comments:  Gravid uterus  Genitourinary:    Comments: Vaginal bleeding Skin:    General: Skin is warm and dry.  Neurological:     Mental Status: She is alert and oriented to person, place, and time.  Psychiatric:        Behavior: Behavior normal.      ED Treatments / Results  Labs (all labs ordered are listed, but only abnormal results are displayed) Labs Reviewed  I-STAT BETA HCG BLOOD, ED ([facility], [facility], [facility] ONLY) - Abnormal; Notable for the following components:      Result Value   I-stat hCG, quantitative >2,000.0 (*)    All other components within normal limits    EKG None  Radiology No results found.  Procedures Procedures (including critical care time)  CRITICAL CARE Performed by: Recardo Evangelist   Total critical care time: 30 minutes  Critical care time was exclusive of separately billable procedures and  treating other patients.  Critical care was necessary to treat or prevent imminent or life-threatening deterioration.  Critical care was time spent personally by me on the following activities: development of treatment plan with patient and/or surrogate as well as nursing, discussions with consultants, evaluation of patient's response to treatment, examination of patient, obtaining history from patient or surrogate, ordering and performing treatments and interventions, ordering and review of laboratory studies, ordering and review of radiographic studies, pulse oximetry and re-evaluation of patient's condition.   Medications Ordered in ED Medications - No data to display   Initial Impression / Assessment and Plan / ED Course  I have reviewed the triage vital signs and the nursing notes.  Pertinent labs & imaging results that were available during my care of the patient were reviewed by me and considered in my medical decision making (see chart for details).  33 year old female presents with acute abdominal pain and distension. She has a gravid uterus. I-stat hcg is >2000. FHT were ~120. She has vaginal bleeding but there is no head palpable. Shared visit with Dr. Vanita Panda. She is stable for transfer to [facility].    Final Clinical Impressions(s) / ED Diagnoses   Final diagnoses:  Generalized abdominal pain    ED Discharge Orders    None       Recardo Evangelist, PA-C 05/30/19 1850    Carmin Muskrat, MD 05/30/19 2320

## 2019-05-30 NOTE — Anesthesia Preprocedure Evaluation (Signed)
Anesthesia Evaluation  Patient identified by MRN, date of birth, ID band Patient awake    Reviewed: Allergy & Precautions, NPO status , Patient's Chart, lab work & pertinent test results  Airway Mallampati: II  TM Distance: >3 FB Neck ROM: Full    Dental no notable dental hx. (+) Teeth Intact   Pulmonary neg pulmonary ROS, Current Smoker,    Pulmonary exam normal breath sounds clear to auscultation       Cardiovascular hypertension, Normal cardiovascular exam Rhythm:Regular Rate:Normal     Neuro/Psych negative neurological ROS  negative psych ROS   GI/Hepatic negative GI ROS, Neg liver ROS,   Endo/Other    Renal/GU negative Renal ROS     Musculoskeletal   Abdominal   Peds  Hematology   Anesthesia Other Findings   Reproductive/Obstetrics (+) Pregnancy  w No prenatal care                             Anesthesia Physical Anesthesia Plan  ASA: III  Anesthesia Plan: Epidural   Post-op Pain Management:    Induction: Intravenous  PONV Risk Score and Plan:   Airway Management Planned:   Additional Equipment:   Intra-op Plan:   Post-operative Plan:   Informed Consent: I have reviewed the patients History and Physical, chart, labs and discussed the procedure including the risks, benefits and alternatives for the proposed anesthesia with the patient or authorized representative who has indicated his/her understanding and acceptance.     Dental advisory given  Plan Discussed with:   Anesthesia Plan Comments:         Anesthesia Quick Evaluation

## 2019-05-30 NOTE — H&P (Signed)
Chief Complaint: No chief complaint on file.    First Provider Initiated Contact with Patient 05/30/2019 at 1851.    SUBJECTIVE HPI: JILLIANNE GAMINO is a 33 y.o. G4P2010 at Unknown who presents to ED reporting abd pain and distension and was found to have iStat Quant and FHR doppler 120. Pt was 7.5/90/-1, BBOW, scant bloody show in MAU. Fundal ht 32 cm. Transported urgently to L&D w/ CNM present. NICU notified of unknown gestational age.   Associated signs and symptoms: Neg for LOF.   Pt states she did not know she was pregnant and LMP was last month. States she has been bleeding every month and has had regular intercourse and doesn't know when she might have gotten pregnant.   Reports Hx 2 vaginal deliveries complicated by Pre-Eclampsia.   No past medical history on file. OB History  Gravida Para Term Preterm AB Living  4 2 2   1     SAB TAB Ectopic Multiple Live Births    1     2    # Outcome Date GA Lbr Len/2nd Weight Sex Delivery Anes PTL Lv  4 Current           3 TAB           2 Term           1 Term            No past surgical history on file. Social History   Socioeconomic History  . Marital status: Single    Spouse name: Not on file  . Number of children: Not on file  . Years of education: Not on file  . Highest education level: Not on file  Occupational History  . Not on file  Social Needs  . Financial resource strain: Not on file  . Food insecurity    Worry: Not on file    Inability: Not on file  . Transportation needs    Medical: Not on file    Non-medical: Not on file  Tobacco Use  . Smoking status: Current Every Day Smoker    Packs/day: 1.00    Types: Cigarettes  . Smokeless tobacco: Never Used  Substance and Sexual Activity  . Alcohol use: Yes    Comment: Occasional  . Drug use: No  . Sexual activity: Yes    Birth control/protection: Implant  Lifestyle  . Physical activity    Days per week: Not on file    Minutes per session: Not on file  .  Stress: Not on file  Relationships  . Social Herbalist on phone: Not on file    Gets together: Not on file    Attends religious service: Not on file    Active member of club or organization: Not on file    Attends meetings of clubs or organizations: Not on file    Relationship status: Not on file  . Intimate partner violence    Fear of current or ex partner: Not on file    Emotionally abused: Not on file    Physically abused: Not on file    Forced sexual activity: Not on file  Other Topics Concern  . Not on file  Social History Narrative  . Not on file   No family history on file. No current facility-administered medications on file prior to encounter.    Current Outpatient Medications on File Prior to Encounter  Medication Sig Dispense Refill  . antipyrine-benzocaine (AURALGAN) otic solution Place 3-4  drops into the left ear every 2 (two) hours as needed for ear pain. 10 mL 0  . Aspirin-Caffeine 845-65 MG PACK Take 1 Package by mouth every 6 (six) hours as needed (for pain).    Marland Kitchen. HYDROcodone-acetaminophen (NORCO/VICODIN) 5-325 MG per tablet Take 1-2 tablets by mouth every 6 (six) hours as needed for moderate pain or severe pain. 15 tablet 0  . ibuprofen (ADVIL,MOTRIN) 200 MG tablet Take 800 mg by mouth every 6 (six) hours as needed for moderate pain. For pain    . naproxen (NAPROSYN) 250 MG tablet Take 1 tablet (250 mg total) by mouth 2 (two) times daily with a meal. 30 tablet 0  . ofloxacin (FLOXIN) 0.3 % otic solution Place 5 drops into the left ear 2 (two) times daily. For 2 weeks 5 mL 0  . penicillin v potassium (VEETID) 500 MG tablet Take 1 tablet (500 mg total) by mouth 4 (four) times daily. 40 tablet 0   No Known Allergies  I have reviewed patient's Past Medical Hx, Surgical Hx, Family Hx, Social Hx, medications and allergies.   Review of Systems  Unable to perform ROS: Acuity of condition  Genitourinary: Positive for vaginal bleeding (scant bloody show).        Neg LOF  Neurological: Negative for headaches.    OBJECTIVE Patient Vitals for the past 24 hrs:  BP Resp  05/30/19 1859 140/74 20   Constitutional: Well-developed, well-nourished female in severe distress. Unable to be still for thorough exam, US or ROS. Cardiovascular: normal rate Respiratory: normal rate and effort.  GI: Abd firm, non-tender, gravid, Fundal ht 32 cm,  MS: Extremities nontender, no edema, normal ROM Neurologic: Alert and oriented x 4.  GU: Dilation: 8 Effacement (%): 90 Station: 0 Exam by:: [place] Kee Drudge, cnm scant bloody show Vertex  LAB RESULTS Results for orders placed or performed during the hospital encounter of 05/30/19 (from the past 24 hour(s))  I-Stat Beta hCG blood, ED ([facility], [facility], [facility] only)     Status: Abnormal   Collection Time: 05/30/19  6:24 PM  Result Value Ref Range   I-stat hCG, quantitative >2,000.0 (H) <5 mIU/mL   Comment 3            IMAGING Attempted informal BS US, but pt writhing, intolerant of exam.   MAU COURSE Orders Placed This Encounter  Procedures  . SARS Coronavirus 2 (CEPHEID - Performed [facility]  . US MFM OB Comp + 14 Weeks  . Urine rapid drug screen (hosp performed)  . Comprehensive metabolic panel  . Protein / creatinine ratio, urine  . CBC  . RPR  . HIV Antibody (routine testing w rflx)  . Hepatitis B surface antigen  . Diet clear liquid Room service appropriate? Yes; Fluid consistency: Thin  . Vitals signs per unit policy  . Notify Physician  . Fetal monitoring per unit policy  . Activity as tolerated  . Cervical Exam  . Measure blood pressure post delivery every 15 min x 1 hour then every 30 min x 1 hour  . Fundal check post delivery every 15 min x 1 hour then every 30 min x 1 hour  . If Rapid HIV test positive or known HIV positive: initiate AZT orders  . May in and out cath x 2 for inability to void  . Insert foley catheter  . Discontinue foley prior to vaginal delivery  .  Initiate Carrier Fluid Protocol  . Initiate Oral Care Protocol  .  Order Rapid HIV per protocol if no results on chart  . Vitals signs per unit policy  . Notify physician  . Fetal monitoring per unit policy  . Activity as tolerated  . Cervical Exam  . Measure blood pressure post delivery every 15 min x 1 hour then every 30 min x 1 hour  . Fundal check post delivery every 15 min x 1 hour then every 30 min x 1 hour  . If Rapid HIV test positive or known HIV positive: initiate AZT orders  . May in and out cath x 2 for inability to void  . Insert foley catheter  . Discontinue foley prior to vaginal delivery  . Initiate Carrier Fluid Protocol  . Initiate Oral Care Protocol  . Order Rapid HIV per protocol if no results on chart  . Full code  . I-Stat Beta hCG blood, ED ([facility], [facility], [facility] only)  . [facility] and screen [facility]  . Insert and maintain IV Line  . Insert and maintain IV Line  . Admit to Inpatient (patient's expected length of stay will be greater than 2 midnights or inpatient only procedure)  . Admit to Inpatient (patient's expected length of stay will be greater than 2 midnights or inpatient only procedure)   Meds ordered this encounter  Medications  . DISCONTD: morphine 4 MG/ML injection 4 mg  . betamethasone acetate-betamethasone sodium phosphate (CELESTONE) injection 12 mg  . fentaNYL (SUBLIMAZE) injection 100 mcg  . ampicillin (OMNIPEN) 2 g in sodium chloride 0.9 % 100 mL IVPB    Order Specific Question:   Antibiotic Indication:    Answer:   Bacteremia  . fentaNYL (SUBLIMAZE) 100 MCG/2ML injection    Lajuana RippleMoyer, Sarah   : cabinet override  . lactated ringers infusion  . oxytocin (PITOCIN) IV BOLUS FROM BAG  . oxytocin (PITOCIN) IV infusion 40 units in NS 1000 mL - Premix  . lactated ringers infusion 500-1,000 mL  . acetaminophen (TYLENOL) tablet 650 mg  . oxyCODONE-acetaminophen (PERCOCET/ROXICET) 5-325 MG per tablet 1 tablet  . oxyCODONE-acetaminophen  (PERCOCET/ROXICET) 5-325 MG per tablet 2 tablet  . ondansetron (ZOFRAN) injection 4 mg  . sodium citrate-citric acid (ORACIT) solution 30 mL  . lidocaine (PF) (XYLOCAINE) 1 % injection 30 mL  . DISCONTD: lactated ringers infusion  . DISCONTD: oxytocin (PITOCIN) IV BOLUS FROM BAG  . DISCONTD: oxytocin (PITOCIN) IV infusion 40 units in NS 1000 mL - Premix  . DISCONTD: lactated ringers infusion 500-1,000 mL  . DISCONTD: acetaminophen (TYLENOL) tablet 650 mg  . DISCONTD: oxyCODONE-acetaminophen (PERCOCET/ROXICET) 5-325 MG per tablet 1 tablet  . DISCONTD: oxyCODONE-acetaminophen (PERCOCET/ROXICET) 5-325 MG per tablet 2 tablet  . sodium phosphate (FLEET) 7-19 GM/118ML enema 1 enema  . DISCONTD: ondansetron (ZOFRAN) injection 4 mg  . DISCONTD: sodium citrate-citric acid (ORACIT) solution 30 mL  . DISCONTD: lidocaine (PF) (XYLOCAINE) 1 % injection 30 mL    Assessment: 1. Labor: Transition 2. Fetal Wellbeing: Category I  3. Pain Control: Fentanyl 4. GBS: Unknow 5. Unknown week IUP--estimate 32 weeks 6. 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 Admit to BS 2. Routine L&D orders 3. Analgesia/anesthesia PRN  4. Ampicillin 2 gm 5. UDS 6. Pre-E labs 7. COVID testing 8. NICU notified 9. BMZ  Katrinka BlazingSmith, [place], [place] 05/30/2019  7:13 PM

## 2019-05-30 NOTE — Progress Notes (Signed)
Fundal height performed indicating 36cm, new estimated gestational age is 45 weeks per CNM, Lacey Ford.

## 2019-05-30 NOTE — MAU Note (Addendum)
Pt to MAU room via stretcher from ED with ROB. When pt in room, pt checked by Manya Silvas, CNM and was 8/90/0 upon cervical examination. Fetal heart tones auscultated via EFM. Pt safe to transport to L&D per Milbert Coulter, CNM  Pt transported to l&d with Manya Silvas, CNM and nurse  NICU notified of pt arrival.   Orders placed for STAT bedside U/S

## 2019-05-30 NOTE — Anesthesia Procedure Notes (Signed)
Epidural Patient location during procedure: OB Start time: 05/30/2019 7:58 PM End time: 05/30/2019 8:05 PM  Staffing Anesthesiologist: Barnet Glasgow, MD Performed: anesthesiologist   Preanesthetic Checklist Completed: patient identified, site marked, surgical consent, pre-op evaluation, timeout performed, IV checked, risks and benefits discussed and monitors and equipment checked  Epidural Patient position: sitting Prep: site prepped and draped and DuraPrep Patient monitoring: continuous pulse ox and blood pressure Approach: midline Location: L3-L4 Injection technique: LOR air  Needle:  Needle type: Tuohy  Needle gauge: 17 G Needle length: 9 cm and 9 Needle insertion depth: 5 cm cm Catheter type: closed end flexible Catheter size: 19 Gauge Catheter at skin depth: 10 cm Test dose: negative  Assessment Events: blood not aspirated, injection not painful, no injection resistance, negative IV test and no paresthesia  Additional Notes Patient identified. Risks/Benefits/Options discussed with patient including but not limited to bleeding, infection, nerve damage, paralysis, failed block, incomplete pain control, headache, blood pressure changes, nausea, vomiting, reactions to medication both or allergic, itching and postpartum back pain. Confirmed with bedside nurse the patient's most recent platelet count. Confirmed with patient that they are not currently taking any anticoagulation, have any bleeding history or any family history of bleeding disorders. Patient expressed understanding and wished to proceed. All questions were answered. Sterile technique was used throughout the entire procedure. Please see nursing notes for vital signs. Test dose was given through epidural needle and negative prior to continuing to dose epidural or start infusion. Warning signs of high block given to the patient including shortness of breath, tingling/numbness in hands, complete motor block, or any  concerning symptoms with instructions to call for help. Patient was given instructions on fall risk and not to get out of bed. All questions and concerns addressed with instructions to call with any issues.  1 Attempt (S) . Patient tolerated procedure well.

## 2019-05-30 NOTE — ED Triage Notes (Addendum)
Patient arrived from home with abdominal distention X4 days and lower back pain. Received 119mcg Fentanyl and 4 mg Zofran from EMS. Patient reports LMP was last month

## 2019-05-30 NOTE — MAU Provider Note (Signed)
Chief Complaint: No chief complaint on file.    First Provider Initiated Contact with Patient 05/30/2019 at 1851.    SUBJECTIVE HPI: JILLIANNE GAMINO is a 33 y.o. G4P2010 at Unknown who presents to ED reporting abd pain and distension and was found to have iStat Quant and FHR doppler 120. Pt was 7.5/90/-1, BBOW, scant bloody show in MAU. Fundal ht 32 cm. Transported urgently to L&D w/ CNM present. NICU notified of unknown gestational age.   Associated signs and symptoms: Neg for LOF.   Pt states she did not know she was pregnant and LMP was last month. States she has been bleeding every month and has had regular intercourse and doesn't know when she might have gotten pregnant.   Reports Hx 2 vaginal deliveries complicated by Pre-Eclampsia.   No past medical history on file. OB History  Gravida Para Term Preterm AB Living  4 2 2   1     SAB TAB Ectopic Multiple Live Births    1     2    # Outcome Date GA Lbr Len/2nd Weight Sex Delivery Anes PTL Lv  4 Current           3 TAB           2 Term           1 Term            No past surgical history on file. Social History   Socioeconomic History  . Marital status: Single    Spouse name: Not on file  . Number of children: Not on file  . Years of education: Not on file  . Highest education level: Not on file  Occupational History  . Not on file  Social Needs  . Financial resource strain: Not on file  . Food insecurity    Worry: Not on file    Inability: Not on file  . Transportation needs    Medical: Not on file    Non-medical: Not on file  Tobacco Use  . Smoking status: Current Every Day Smoker    Packs/day: 1.00    Types: Cigarettes  . Smokeless tobacco: Never Used  Substance and Sexual Activity  . Alcohol use: Yes    Comment: Occasional  . Drug use: No  . Sexual activity: Yes    Birth control/protection: Implant  Lifestyle  . Physical activity    Days per week: Not on file    Minutes per session: Not on file  .  Stress: Not on file  Relationships  . Social Herbalist on phone: Not on file    Gets together: Not on file    Attends religious service: Not on file    Active member of club or organization: Not on file    Attends meetings of clubs or organizations: Not on file    Relationship status: Not on file  . Intimate partner violence    Fear of current or ex partner: Not on file    Emotionally abused: Not on file    Physically abused: Not on file    Forced sexual activity: Not on file  Other Topics Concern  . Not on file  Social History Narrative  . Not on file   No family history on file. No current facility-administered medications on file prior to encounter.    Current Outpatient Medications on File Prior to Encounter  Medication Sig Dispense Refill  . antipyrine-benzocaine (AURALGAN) otic solution Place 3-4  drops into the left ear every 2 (two) hours as needed for ear pain. 10 mL 0  . Aspirin-Caffeine 845-65 MG PACK Take 1 Package by mouth every 6 (six) hours as needed (for pain).    Marland Kitchen. HYDROcodone-acetaminophen (NORCO/VICODIN) 5-325 MG per tablet Take 1-2 tablets by mouth every 6 (six) hours as needed for moderate pain or severe pain. 15 tablet 0  . ibuprofen (ADVIL,MOTRIN) 200 MG tablet Take 800 mg by mouth every 6 (six) hours as needed for moderate pain. For pain    . naproxen (NAPROSYN) 250 MG tablet Take 1 tablet (250 mg total) by mouth 2 (two) times daily with a meal. 30 tablet 0  . ofloxacin (FLOXIN) 0.3 % otic solution Place 5 drops into the left ear 2 (two) times daily. For 2 weeks 5 mL 0  . penicillin v potassium (VEETID) 500 MG tablet Take 1 tablet (500 mg total) by mouth 4 (four) times daily. 40 tablet 0   No Known Allergies  I have reviewed patient's Past Medical Hx, Surgical Hx, Family Hx, Social Hx, medications and allergies.   Review of Systems  Unable to perform ROS: Acuity of condition  Genitourinary: Positive for vaginal bleeding (scant bloody show).        Neg LOF  Neurological: Negative for headaches.    OBJECTIVE Patient Vitals for the past 24 hrs:  BP Resp  05/30/19 1859 140/74 20   Constitutional: Well-developed, well-nourished female in severe distress. Unable to be still for thorough exam, US or ROS. Cardiovascular: normal rate Respiratory: normal rate and effort.  GI: Abd firm, non-tender, gravid, Fundal ht 32 cm,  MS: Extremities nontender, no edema, normal ROM Neurologic: Alert and oriented x 4.  GU: Dilation: 8 Effacement (%): 90 Station: 0 Exam by:: [place] Abigial [place], cnm scant bloody show Vertex  LAB RESULTS Results for orders placed or performed during the hospital encounter of 05/30/19 (from the past 24 hour(s))  I-Stat Beta hCG blood, ED ([facility], [facility], [facility] only)     Status: Abnormal   Collection Time: 05/30/19  6:24 PM  Result Value Ref Range   I-stat hCG, quantitative >2,000.0 (H) <5 mIU/mL   Comment 3            IMAGING Attempted informal BS US, but pt writhing, intolerant of exam.   MAU COURSE Orders Placed This Encounter  Procedures  . SARS Coronavirus 2 (CEPHEID - Performed in [facility]), [facility] Order  . US MFM OB Comp + 14 Weeks  . Urine rapid drug screen (hosp performed)  . Comprehensive metabolic panel  . Protein / creatinine ratio, urine  . CBC  . RPR  . HIV Antibody (routine testing w rflx)  . Hepatitis B surface antigen  . Diet clear liquid Room service appropriate? Yes; Fluid consistency: Thin  . Vitals signs per unit policy  . Notify Physician  . Fetal monitoring per unit policy  . Activity as tolerated  . Cervical Exam  . Measure blood pressure post delivery every 15 min x 1 hour then every 30 min x 1 hour  . Fundal check post delivery every 15 min x 1 hour then every 30 min x 1 hour  . If Rapid HIV test positive or known HIV positive: initiate AZT orders  . May in and out cath x 2 for inability to void  . Insert foley catheter  . Discontinue foley prior to vaginal delivery  .  Initiate Carrier Fluid Protocol  . Initiate Oral Care Protocol  .  Order Rapid HIV per protocol if no results on chart  . Vitals signs per unit policy  . Notify physician  . Fetal monitoring per unit policy  . Activity as tolerated  . Cervical Exam  . Measure blood pressure post delivery every 15 min x 1 hour then every 30 min x 1 hour  . Fundal check post delivery every 15 min x 1 hour then every 30 min x 1 hour  . If Rapid HIV test positive or known HIV positive: initiate AZT orders  . May in and out cath x 2 for inability to void  . Insert foley catheter  . Discontinue foley prior to vaginal delivery  . Initiate Carrier Fluid Protocol  . Initiate Oral Care Protocol  . Order Rapid HIV per protocol if no results on chart  . Full code  . I-Stat Beta hCG blood, ED ([facility], [facility], [facility] only)  . Type and screen [facility]  . Insert and maintain IV Line  . Insert and maintain IV Line  . Admit to Inpatient (patient's expected length of stay will be greater than 2 midnights or inpatient only procedure)  . Admit to Inpatient (patient's expected length of stay will be greater than 2 midnights or inpatient only procedure)   Meds ordered this encounter  Medications  . DISCONTD: morphine 4 MG/ML injection 4 mg  . betamethasone acetate-betamethasone sodium phosphate (CELESTONE) injection 12 mg  . fentaNYL (SUBLIMAZE) injection 100 mcg  . ampicillin (OMNIPEN) 2 g in sodium chloride 0.9 % 100 mL IVPB    Order Specific Question:   Antibiotic Indication:    Answer:   Bacteremia  . fentaNYL (SUBLIMAZE) 100 MCG/2ML injection    Moyer, Sarah   : cabinet override  . lactated ringers infusion  . oxytocin (PITOCIN) IV BOLUS FROM BAG  . oxytocin (PITOCIN) IV infusion 40 units in NS 1000 mL - Premix  . lactated ringers infusion 500-1,000 mL  . acetaminophen (TYLENOL) tablet 650 mg  . oxyCODONE-acetaminophen (PERCOCET/ROXICET) 5-325 MG per tablet 1 tablet  . oxyCODONE-acetaminophen  (PERCOCET/ROXICET) 5-325 MG per tablet 2 tablet  . ondansetron (ZOFRAN) injection 4 mg  . sodium citrate-citric acid (ORACIT) solution 30 mL  . lidocaine (PF) (XYLOCAINE) 1 % injection 30 mL  . DISCONTD: lactated ringers infusion  . DISCONTD: oxytocin (PITOCIN) IV BOLUS FROM BAG  . DISCONTD: oxytocin (PITOCIN) IV infusion 40 units in NS 1000 mL - Premix  . DISCONTD: lactated ringers infusion 500-1,000 mL  . DISCONTD: acetaminophen (TYLENOL) tablet 650 mg  . DISCONTD: oxyCODONE-acetaminophen (PERCOCET/ROXICET) 5-325 MG per tablet 1 tablet  . DISCONTD: oxyCODONE-acetaminophen (PERCOCET/ROXICET) 5-325 MG per tablet 2 tablet  . sodium phosphate (FLEET) 7-19 GM/118ML enema 1 enema  . DISCONTD: ondansetron (ZOFRAN) injection 4 mg  . DISCONTD: sodium citrate-citric acid (ORACIT) solution 30 mL  . DISCONTD: lidocaine (PF) (XYLOCAINE) 1 % injection 30 mL    Assessment: 1. Labor: Transition 2. Fetal Wellbeing: Category I  3. Pain Control: Fentanyl 4. GBS: Unknow 5. Unknown week IUP--estimate 32 weeks 6. 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 Admit to BS 2. Routine L&D orders 3. Analgesia/anesthesia PRN  4. Ampicillin 2 gm 5. UDS 6. Pre-E labs 7. COVID testing 8. NICU notified 9. BMZ  Shubham Thackston, CNM 05/30/2019  7:13 PM  

## 2019-05-30 NOTE — ED Notes (Signed)
SisterAngela Nevin [phone] for updates

## 2019-05-31 ENCOUNTER — Encounter (HOSPITAL_COMMUNITY): Payer: Self-pay | Admitting: Advanced Practice Midwife

## 2019-05-31 ENCOUNTER — Other Ambulatory Visit: Payer: Self-pay

## 2019-05-31 DIAGNOSIS — O99324 Drug use complicating childbirth: Secondary | ICD-10-CM

## 2019-05-31 DIAGNOSIS — O1404 Mild to moderate pre-eclampsia, complicating childbirth: Secondary | ICD-10-CM

## 2019-05-31 DIAGNOSIS — Z3A00 Weeks of gestation of pregnancy not specified: Secondary | ICD-10-CM

## 2019-05-31 LAB — HEPATITIS B SURFACE ANTIGEN: Hepatitis B Surface Ag: NEGATIVE

## 2019-05-31 LAB — ABO/RH: ABO/RH(D): A POS

## 2019-05-31 LAB — RPR: RPR Ser Ql: NONREACTIVE

## 2019-05-31 MED ORDER — BISACODYL 10 MG RE SUPP: 10.0000 mg | Freq: Every day | RECTAL | Status: DC | PRN

## 2019-05-31 MED ORDER — BENZOCAINE-MENTHOL 20-0.5 % EX AERO: 1.0000 "application " | INHALATION_SPRAY | CUTANEOUS | Status: DC | PRN

## 2019-05-31 MED ORDER — PRENATAL MULTIVITAMIN CH
1.0000 | ORAL_TABLET | Freq: Every day | ORAL | Status: DC
  Administered 2019-05-31 – 2019-06-01 (×2): 1 via ORAL
  Filled 2019-05-31 (×2): qty 1

## 2019-05-31 MED ORDER — IBUPROFEN 600 MG PO TABS
600.0000 mg | ORAL_TABLET | Freq: Four times a day (QID) | ORAL | Status: DC
  Administered 2019-05-31 – 2019-06-01 (×5): 600 mg via ORAL
  Filled 2019-05-31 (×5): qty 1

## 2019-05-31 MED ORDER — COCONUT OIL OIL: 1.0000 "application " | TOPICAL_OIL | Status: DC | PRN

## 2019-05-31 MED ORDER — ONDANSETRON HCL 4 MG/2ML IJ SOLN: 4.0000 mg | INTRAMUSCULAR | Status: DC | PRN

## 2019-05-31 MED ORDER — ACETAMINOPHEN 325 MG PO TABS
650.0000 mg | ORAL_TABLET | ORAL | Status: DC | PRN
  Administered 2019-06-01: 10:00:00 650 mg via ORAL
  Filled 2019-05-31: qty 2

## 2019-05-31 MED ORDER — FLEET ENEMA 7-19 GM/118ML RE ENEM: 1.0000 | ENEMA | Freq: Every day | RECTAL | Status: DC | PRN

## 2019-05-31 MED ORDER — DIBUCAINE (PERIANAL) 1 % EX OINT: 1.0000 "application " | TOPICAL_OINTMENT | CUTANEOUS | Status: DC | PRN

## 2019-05-31 MED ORDER — TETANUS-DIPHTH-ACELL PERTUSSIS 5-2.5-18.5 LF-MCG/0.5 IM SUSP: 0.5000 mL | Freq: Once | INTRAMUSCULAR | Status: DC

## 2019-05-31 MED ORDER — DOCUSATE SODIUM 100 MG PO CAPS
100.0000 mg | ORAL_CAPSULE | Freq: Two times a day (BID) | ORAL | Status: DC
  Administered 2019-06-01 (×2): 100 mg via ORAL
  Filled 2019-05-31 (×2): qty 1

## 2019-05-31 MED ORDER — MISOPROSTOL 200 MCG PO TABS
800.0000 ug | ORAL_TABLET | Freq: Once | ORAL | Status: AC
  Administered 2019-05-31: 01:00:00 800 ug via RECTAL

## 2019-05-31 MED ORDER — METHYLERGONOVINE MALEATE 0.2 MG PO TABS: 0.2000 mg | ORAL_TABLET | ORAL | Status: DC | PRN

## 2019-05-31 MED ORDER — SIMETHICONE 80 MG PO CHEW: 80.0000 mg | CHEWABLE_TABLET | ORAL | Status: DC | PRN

## 2019-05-31 MED ORDER — DIPHENHYDRAMINE HCL 25 MG PO CAPS
25.0000 mg | ORAL_CAPSULE | Freq: Four times a day (QID) | ORAL | Status: DC | PRN
  Administered 2019-05-31: 25 mg via ORAL
  Filled 2019-05-31: qty 1

## 2019-05-31 MED ORDER — METHYLERGONOVINE MALEATE 0.2 MG/ML IJ SOLN: 0.2000 mg | INTRAMUSCULAR | Status: DC | PRN

## 2019-05-31 MED ORDER — WITCH HAZEL-GLYCERIN EX PADS: 1.0000 "application " | MEDICATED_PAD | CUTANEOUS | Status: DC | PRN

## 2019-05-31 MED ORDER — MISOPROSTOL 200 MCG PO TABS
ORAL_TABLET | ORAL | Status: AC
  Filled 2019-05-31: qty 4

## 2019-05-31 MED ORDER — FERROUS SULFATE 325 (65 FE) MG PO TABS
325.0000 mg | ORAL_TABLET | Freq: Two times a day (BID) | ORAL | Status: DC
  Administered 2019-05-31 – 2019-06-01 (×3): 325 mg via ORAL
  Filled 2019-05-31 (×3): qty 1

## 2019-05-31 MED ORDER — ONDANSETRON HCL 4 MG PO TABS: 4.0000 mg | ORAL_TABLET | ORAL | Status: DC | PRN

## 2019-05-31 MED ORDER — MEASLES, MUMPS & RUBELLA VAC IJ SOLR: 0.5000 mL | Freq: Once | INTRAMUSCULAR | Status: DC

## 2019-05-31 MED ORDER — ZOLPIDEM TARTRATE 5 MG PO TABS: 5.0000 mg | ORAL_TABLET | Freq: Every evening | ORAL | Status: DC | PRN

## 2019-05-31 NOTE — Anesthesia Postprocedure Evaluation (Signed)
Anesthesia Post Note  Patient: Lacey Ford  Procedure(s) Performed: AN AD HOC LABOR EPIDURAL     Patient location during evaluation: OB High Risk Anesthesia Type: Epidural Level of consciousness: awake and alert Pain management: pain level controlled Vital Signs Assessment: post-procedure vital signs reviewed and stable Respiratory status: spontaneous breathing and nonlabored ventilation Cardiovascular status: blood pressure returned to baseline and stable Postop Assessment: no headache, no backache and able to ambulate Anesthetic complications: no    Last Vitals:  Vitals:   05/31/19 0850 05/31/19 1145  BP: 125/72 128/76  Pulse: 79 82  Resp: 16 16  Temp: 36.4 C (!) 36.4 C  SpO2: 97% 97%    Last Pain:  Vitals:   05/31/19 1348  TempSrc:   PainSc: Asleep   Pain Goal:                   Gwyndolyn Saxon

## 2019-05-31 NOTE — Progress Notes (Signed)
CSW spoke with MOB at bedside to provided list of Adoption Agencies for MOB in the  event that she chooses to follow through with adoption. MOB thanked CSW and informed CSW that she has been thinking a lot. CSW informed MOB that this was appropriate and that CSW and other staff are here to offer support to her and infant regardless of which decision she ,made. MOB thanked CSW. CSW will follow up wtih MOB on 06/01/2019 to see if decision has been made.     Lacey Ford S. Keosha Rossa, MSW, LCSW-A [facility] [place] [phone]  

## 2019-05-31 NOTE — Consult Note (Signed)
Neonatology Note:   Attendance at Delivery:    I was asked by F. Cresenzo-Dishmon, CNM for Dr. Arnold to attend this NVSD due to unknown GA. The mother is a G4P2A1 A pos, GBS pending who presented to the ED this evening with abdominal pain and distention. She was found to be pregnant and 8 cm dilated. She was given a dose of Betamethasone and Ampicillin 5 hours before delivery; she also got a dose of Fentanyl at that time. Her UDS is positive for Amphetamines and THC. She smokes cigarettes, 1 pack/day.  ROM just before delivery, fluid clear. Infant vigorous with good spontaneous cry and tone. Delayed cord clamping was done. The baby had a lot of oral secretions and needed bulb suctioning, then DeLee suctioning X 1 for about 10 ml of clear mucous. By 6 minutes, his O2 saturation was about 70%, so BBO2 was started, with good response. Ap 7/8. Lungs clear to ausc in DR, no retractions, but we could not wean the baby off supplemental O2 after about 15 minutes, so he was transported to the NICU getting BBO2. He appears to be 35-[redacted] weeks GA by exam.  Mother thinks she wants to place the baby up for adoption and did not want to see him post-delivery. She did ask me to tell her how he was doing, which I did, and answered her questions. She would like to be informed if there is a change in his condition.   Lacey Minerd C. Rosebud Koenen, MD 

## 2019-05-31 NOTE — Progress Notes (Signed)
I was paged by pt's nurse to offer emotional and spiritual support.  Pt wanted to talk, but needed to rest for today.  She requested a visit from a chaplain tomorrow. I will pass this on to my colleague, Chaplain Ree Edman.  If anything arises sooner, please page [place] at [place], Bcc 5:12 PM

## 2019-05-31 NOTE — Clinical Social Work Maternal (Signed)
CLINICAL SOCIAL WORK MATERNAL/CHILD NOTE  Patient Details  Name: Lacey Ford MRN: [id number] Date of Birth: 1986-10-20  Date:  05/31/2019  Clinical Social Worker Initiating Note:  Hortencia PilarKierra Desteny Freeman, LCSWA Date/Time: Initiated:  05/31/19/1100     Child's Name:      Biological Parents:  Mother   Need for Interpreter:  None   Reason for Referral:  Adoption, Current Substance Use/Substance Use During Pregnancy    Address:  [address] [place], [place], [address]   Phone number:  [phone]   Additional phone number:   Household Members/Support Persons (HM/SP):   Household Member/Support Person 3   HM/SP Name Relationship DOB or Age  HM/SP -1   Lacey BraverJessica Ford (MOB)  MOB   32  HM/SP -2   Lacey Ford (daughter)  daughter   09/26/2003  HM/SP -3 Lacey Ford (son) Son  05/21/2008  HM/SP -4        HM/SP -5        HM/SP -6        HM/SP -7        HM/SP -8          Natural Supports (not living in the home):      Professional Supports: None   Employment: Unemployed   Type of Work:   none   Education:  9 to 11 years   Homebound arranged:    Surveyor, quantityinancial Resources:  Medicaid   Other Resources:    none at this time   Cultural/Religious Considerations Which May Impact Care:  none reported   Strengths:  Compliance with medical plan    Psychotropic Medications:       None   Pediatrician:       [place]    [place] [facility]    [place]    [place]      Pediatrician Fax Number:    Risk Factors/Current Problems:      Cognitive State:  Confused, Able to Concentrate , Alert    Mood/Affect:  Tearful , Sad , Overwhelmed    CSW Assessment: CSW consulted as MOB used substances while pregnant as well as MOB considering adoption for infant. CSW went to speak with MOB at bedside. Upon entering the room MOB was asleep in bed. CSW woke MOB and and introduced role. MOB was advised by CSW the reason for  the visit. MOB reported understanding and open to speak with CSW.   CSW began conversation with MOB by informing MOB of the reason for the visit. MOB reported that she did not know that she was pregnant with infant. MOB reported that she wouldn't have been using substances if she had known. MOB reported that she didn't tell any of her family about having a baby as she doesn't want anyone to influence her decision on whether or not to give infant up for adoption. CSW understanding and proceed with speak with MOB. MOB was advised that infant is in the NICU. MOB expressed that she wasn't aware of this. CSW offered to have NICU MD come and speak with MOB to give further updates on infant. 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 MOB expressed that she smoke THC about a week ago and then used meth almost two weeks ago. MOB was very resentful in doing such. MOB was advised by CSW that infant would need to have two different drug screens. MOB was advised that infants UDS was  positive and therefore CSW would need to make a report to [place]. MOB appeared a little alarmed by asking CSW "are they going to tell my other children". CSW verbalized to MOB uncertainty of this but suggested that if CPS or when CPS comes out to speak wth MOB that she update them that she wouldn't like her children or family to know about pregnancy. MOB understanding. MOB reported that she didn't know her other children would be impacted by her giving birth.   CSW spoke with MOB in detail regarding adoption. MOB very tearful and expressed that she isnt sure at this time.CSW offered MOB support and suggested that MOB could still take time to think about what choice to make. CSW offered MOB information on Adoption Agencies and MOB agreeable for CSW to return with that information. MOB reported that she is confused as she is unsure if she can take care of her other two children and new infant. CSW advised MOB that if MOB  chose to keep infant then pother resources could be provided to assist with infants care. MOB understanding but suggested "I dont  Even have money to send my other two children to college". CSW verbalized understanding and advised MOB to take time to think before making a decision.   MOB reported that doesn't have any mental health diagnosed within the last 10 years. MOB reported that she went to a place when she was younger and was said to have Bipolar but MOB reports that she doesn't. MOB reported that she doesn't need substances abuse resources at this time. MOB is from home with older children and reported that they are with her dad at this time. MOB current not working and expressed that FOB is incarcerated . MOB denies feeling SI or HI at this time. MOB also denies having PPD with oldest two children.  MOB notified by CSW that CSW would be making CPS report for infants positive UDS. At this time there are barriers to infant discharging.   CSW Plan/Description:  Psychosocial Support and Ongoing Assessment of Needs, Child Protective Service Report , Other Information/Referral to [place], CSW Awaiting CPS Disposition Plan, CSW Will Continue to Monitor Umbilical Cord Tissue Drug Screen Results and Make Report if Mardene Sayer, [place] 05/31/2019, 11:47 AM

## 2019-05-31 NOTE — Progress Notes (Signed)
CSW made [facility] CPS report for infant's positive UDS for [facility] and Amphetamines.   At this there are barriers to infant discharging.       Virgie Dad Barton Want, MSW, LCSW-A [facility] [place] [facility] [place] [phone]

## 2019-05-31 NOTE — Discharge Summary (Signed)
Postpartum Discharge Summary     Patient Name: Lacey Ford DOB: [birth year] MRN: [id number]  Date of admission: 05/30/2019 Delivering Provider: Christin Fudge   Date of discharge: 06/01/2019  Admitting diagnosis: Abd and Back Pain Intrauterine pregnancy: Unknown     Secondary diagnosis:  Principal Problem:   Preterm delivery, delivered Active Problems:   Preeclampsia  Additional problems: drug abuse, baby up for adoption     Discharge diagnosis: Preterm Pregnancy Delivered and Preeclampsia (mild)                                                                                                Post partum procedures:None  Augmentation: AROM  Complications: None  Hospital course:  Onset of Labor With Vaginal Delivery     33 y.o. yo G4P2010 at Unknown was admitted in Active Labor on 05/30/2019. Patient had a labor course as follows: arrived 8cms dilated, received epidural.  5 hours later, pt C/C/+3. AROM, short 2nd stage. She was also diagnosed with intrapartum preeclampsia without severe features.  At this time, pt doesn't want to see baby, plans to adopt.  Social work consulted  Membrane Rupture Time/Date: 12:11 AM ,05/31/2019   Intrapartum Procedures: Episiotomy: None [1]                                         Lacerations:  None [1]  Patient had a delivery of a Viable infant. 05/31/2019  Information for the patient's newborn:  Alonni, [place] [[id number]]  Delivery Method: Vaginal, Spontaneous(Filed from Delivery Summary)    Patient had an uncomplicated postpartum course.  She is ambulating, tolerating a regular diet, passing flatus, and urinating well. BP was stable.  Patient is discharged home in stable condition on 06/01/19.  Magnesium Sulfate recieved: No BMZ received: Yes  Physical exam  Vitals:   05/31/19 1937 05/31/19 2100 06/01/19 0515 06/01/19 0745  BP: 132/88  113/66 128/79  Pulse: 68  80 77  Resp: 16  18 18   Temp: (!) 97.2 F (36.2 C) 98 F (36.7 C) 98 F  (36.7 C) 98.1 F (36.7 C)  TempSrc: Axillary Axillary Axillary Oral  SpO2: 100%   97%   General: alert, cooperative and no distress Lochia: appropriate Uterine Fundus: firm Incision: N/A DVT Evaluation: No evidence of DVT seen on physical exam. Negative Homan's sign. No cords or calf tenderness. No significant calf/ankle edema. Labs: Lab Results  Component Value Date   WBC 11.1 (H) 05/30/2019   HGB 9.9 (L) 05/30/2019   HCT 30.6 (L) 05/30/2019   MCV 80.1 05/30/2019   PLT 155 05/30/2019   CMP Latest Ref Rng & Units 05/30/2019  Glucose 70 - 99 mg/dL 124(H)  BUN 6 - 20 mg/dL <5(L)  Creatinine 0.44 - 1.00 mg/dL 0.49  Sodium 135 - 145 mmol/L 137  Potassium 3.5 - 5.1 mmol/L 3.0(L)  Chloride 98 - 111 mmol/L 107  CO2 22 - 32 mmol/L 21(L)  Calcium 8.9 - 10.3 mg/dL 8.6(L)  Total Protein 6.5 - 8.1 g/dL  6.3(L)  Total Bilirubin 0.3 - 1.2 mg/dL 0.7  Alkaline Phos 38 - 126 U/L 180(H)  AST 15 - 41 U/L 15  ALT 0 - 44 U/L 11    Discharge instruction: per After Visit Summary and "Baby and Me Booklet".  After visit meds:  Allergies as of 06/01/2019   No Known Allergies     Medication List    STOP taking these medications   antipyrine-benzocaine OTIC solution Commonly known as: AURALGAN   Aspirin-Caffeine 845-65 MG Pack   HYDROcodone-acetaminophen 5-325 MG tablet Commonly known as: NORCO/VICODIN   naproxen 250 MG tablet Commonly known as: Naprosyn   ofloxacin 0.3 % OTIC solution Commonly known as: FLOXIN   penicillin v potassium 500 MG tablet Commonly known as: VEETID     TAKE these medications   docusate sodium 100 MG capsule Commonly known as: COLACE Take 1 capsule (100 mg total) by mouth 2 (two) times daily as needed for mild constipation.   ibuprofen 600 MG tablet Commonly known as: ADVIL Take 1 tablet (600 mg total) by mouth every 6 (six) hours as needed. What changed:   medication strength  how much to take  reasons to take this  additional  instructions       Diet: routine diet  Activity: Advance as tolerated. Pelvic rest for 6 weeks.   Outpatient follow up: Please schedule this patient for Postpartum visit in: 4 weeks with the following provider: Any provider For C/S patients schedule nurse incision check in weeks 2 weeks: no High risk pregnancy complicated by: HTN Delivery mode:  SVD Anticipated Birth Control:  other/unsure PP Procedures needed: BP check  Schedule Integrated BH visit: yes  Didn't know she was pregnant (denied a lot of the signs; trying to keep it a secret from everyone, at this time wants BUFA   Newborn Data: Live born female  Birth Weight: 3190g  APGAR: 7, 8,9  Newborn Delivery   Birth date/time: 05/31/2019 00:31:00 Delivery type: Vaginal, Spontaneous      Baby Feeding: Bottle Disposition:NICU   06/01/2019 Jaynie CollinsUgonna Cariah Salatino, MD

## 2019-05-31 NOTE — Progress Notes (Signed)
Stopped by to say hi, patient was sleeping soundly.

## 2019-06-01 LAB — RAPID HIV SCREEN (HIV 1/2 AB+AG)
HIV 1/2 Antibodies: NONREACTIVE
HIV-1 P24 Antigen - HIV24: NONREACTIVE

## 2019-06-01 LAB — HIV ANTIBODY (ROUTINE TESTING W REFLEX): HIV Screen 4th Generation wRfx: NONREACTIVE

## 2019-06-01 MED ORDER — DOCUSATE SODIUM 100 MG PO CAPS: 100.0000 mg | ORAL_CAPSULE | Freq: Two times a day (BID) | ORAL | 1 refills | Status: DC | PRN

## 2019-06-01 MED ORDER — IBUPROFEN 600 MG PO TABS: 600.0000 mg | ORAL_TABLET | Freq: Four times a day (QID) | ORAL | 2 refills | Status: DC | PRN

## 2019-06-01 NOTE — Progress Notes (Addendum)
Post Partum Day 1 Subjective: no complaints, up ad lib, voiding and tolerating PO  Objective: Blood pressure 113/66, pulse 80, temperature 98 F (36.7 C), temperature source Axillary, resp. rate 18, SpO2 100 %.  Physical Exam:  General: alert, cooperative and appears older than stated age Lochia: appropriate Uterine Fundus: firm DVT Evaluation: Negative Homan's sign. Extremities: Implanon noted in left upper arm  Recent Labs    05/30/19 1923  HGB 9.9*  HCT 30.6*    Assessment/Plan: Baby remains in NICU Still undecided about plans Would like d/c later today. Will come back in mid-afternoon to d/c pt. CSW follow-up Bottle feeding Needs Nexplanon out and consideration of other contraception   LOS: 2 days   Lacey Ford 06/01/2019, 7:27 AM

## 2019-06-01 NOTE — Discharge Instructions (Signed)
Postpartum Baby Blues The postpartum period begins right after the birth of a baby. During this time, there is often a lot of joy and excitement. It is also a time of many changes in the life of the parents. No matter how many times a mother gives birth, each child brings new challenges to the family, including different ways of relating to one another. It is common to have feelings of excitement along with confusing changes in moods, emotions, and thoughts. You may feel happy one minute and sad or stressed the next. These feelings of sadness usually happen in the period right after you have your baby, and they go away within a week or two. This is called the "baby blues." What are the causes? There is no known cause of baby blues. It is likely caused by a combination of factors. However, changes in hormone levels after childbirth are believed to trigger some of the symptoms. Other factors that can play a role in these mood changes include: Lack of sleep. Stressful life events, such as poverty, caring for a loved one, or death of a loved one. Genetics. What are the signs or symptoms? Symptoms of this condition include: Brief changes in mood, such as going from extreme happiness to sadness. Decreased concentration. Difficulty sleeping. Crying spells and tearfulness. Loss of appetite. Irritability. Anxiety. If the symptoms of baby blues last for more than 2 weeks or become more severe, you may have postpartum depression. How is this diagnosed? This condition is diagnosed based on an evaluation of your symptoms. There are no medical or lab tests that lead to a diagnosis, but there are various questionnaires that a health care provider may use to identify women with the baby blues or postpartum depression. How is this treated? Treatment is not needed for this condition. The baby blues usually go away on their own in 1-2 weeks. Social support is often all that is needed. You will be encouraged to  get adequate sleep and rest. Follow these instructions at home: Lifestyle     Get as much rest as you can. Take a nap when the baby sleeps. Exercise regularly as told by your health care provider. Some women find yoga and walking to be helpful. Eat a balanced and nourishing diet. This includes plenty of fruits and vegetables, whole grains, and lean proteins. Do little things that you enjoy. Have a cup of tea, take a bubble bath, read your favorite magazine, or listen to your favorite music. Avoid alcohol. Ask for help with household chores, cooking, grocery shopping, or running errands. Do not try to do everything yourself. Consider hiring a postpartum doula to help. This is a professional who specializes in providing support to new mothers. Try not to make any major life changes during pregnancy or right after giving birth. This can add stress. General instructions Talk to people close to you about how you are feeling. Get support from your partner, family members, friends, or other new moms. You may want to join a support group. Find ways to cope with stress. This may include: Writing your thoughts and feelings in a journal. Spending time outside. Spending time with people who make you laugh. Try to stay positive in how you think. Think about the things you are grateful for. Take over-the-counter and prescription medicines only as told by your health care provider. Let your health care provider know if you have any concerns. Keep all postpartum visits as told by your health care provider. This is important.  Let your health care provider know if you have any concerns. °· Keep all postpartum visits as told by your health care provider. This is important. °Contact a health care provider if: °· Your baby blues do not go away after 2 weeks. °Get help right away if: °· You have thoughts of taking your own life (suicidal thoughts). °· You think you may harm the baby or other people. °· You see or hear things that are not there (hallucinations). °Summary °· After giving birth, you may feel happy  one minute and sad or stressed the next. Feelings of sadness that happen right after the baby is born and go away after a week or two are called the "baby blues." °· You can manage the baby blues by getting enough rest, eating a healthy diet, exercising, spending time with supportive people, and finding ways to cope with stress. °· If feelings of sadness and stress last longer than 2 weeks or get in the way of caring for your baby, talk to your health care provider. This may mean you have postpartum depression. °This information is not intended to replace advice given to you by your health care provider. Make sure you discuss any questions you have with your health care provider. °Document Released: 08/18/2004 Document Revised: 03/08/2019 Document Reviewed: 01/10/2017 °Elsevier Patient Education © 2020 [employer] Inc[employer] °Preeclampsia and Eclampsia °Preeclampsia is a serious condition that may develop during pregnancy. This condition causes high blood pressure and increased protein in your urine along with other symptoms, such as headaches and vision changes. These symptoms may develop as the condition gets worse. Preeclampsia may occur at 20 weeks of pregnancy or later. °Diagnosing and treating preeclampsia early is very important. If not treated early, it can cause serious problems for you and your baby. One problem it can lead to is eclampsia. Eclampsia is a condition that causes muscle jerking or shaking (convulsions or seizures) and other serious problems for the mother. During pregnancy, delivering your baby may be the best treatment for preeclampsia or eclampsia. For most women, preeclampsia and eclampsia symptoms go away after giving birth. °In rare cases, a woman may develop preeclampsia after giving birth (postpartum preeclampsia). This usually occurs within 48 hours after childbirth but may occur up to 6 weeks after giving birth. °What are the causes? °The cause of preeclampsia is not known. °What increases  the risk? °The following risk factors make you more likely to develop preeclampsia: °· Being pregnant for the first time. °· Having had preeclampsia during a past pregnancy. °· Having a family history of preeclampsia. °· Having high blood pressure. °· Being pregnant with more than one baby. °· Being 35 or older. °· Being African-American. °· Having kidney disease or diabetes. °· Having medical conditions such as lupus or blood diseases. °· Being very overweight (obese). °What are the signs or symptoms? °The most common symptoms are: °· Severe headaches. °· Vision problems, such as blurred or double vision. °· Abdominal pain, especially upper abdominal pain. °Other symptoms that may develop as the condition gets worse include: °· Sudden weight gain. °· Sudden swelling of the hands, face, legs, and feet. °· Severe nausea and vomiting. °· Numbness in the face, arms, legs, and feet. °· Dizziness. °· Urinating less than usual. °· Slurred speech. °· Convulsions or seizures. °How is this diagnosed? °There are no screening tests for preeclampsia. Your health care provider will ask you about symptoms and check for signs of preeclampsia during your prenatal visits. You may also have   tests that include: °· Checking your blood pressure. °· Urine tests to check for protein. Your health care provider will check for this at every prenatal visit. °· Blood tests. °· Monitoring your baby's heart rate. °· Ultrasound. °How is this treated? °You and your health care provider will determine the treatment approach that is best for you. Treatment may include: °· Having more frequent prenatal exams to check for signs of preeclampsia, if you have an increased risk for preeclampsia. °· Medicine to lower your blood pressure. °· Staying in the hospital, if your condition is severe. There, treatment will focus on controlling your blood pressure and the amount of fluids in your body (fluid retention). °· Taking medicine (magnesium sulfate) to  prevent seizures. This may be given as an injection or through an IV. °· Taking a low-dose aspirin during your pregnancy. °· Delivering your baby early. You may have your labor started with medicine (induced), or you may have a cesarean delivery. °Follow these instructions at home: °Eating and drinking ° °· Drink enough fluid to keep your urine pale yellow. °· Avoid caffeine. °Lifestyle °· Do not use any products that contain nicotine or tobacco, such as cigarettes and e-cigarettes. If you need help quitting, ask your health care provider. °· Do not use alcohol or drugs. °· Avoid stress as much as possible. Rest and get plenty of sleep. °General instructions °· Take over-the-counter and prescription medicines only as told by your health care provider. °· When lying down, lie on your left side. This keeps pressure off your major blood vessels. °· When sitting or lying down, raise (elevate) your feet. Try putting some pillows underneath your lower legs. °· Exercise regularly. Ask your health care provider what kinds of exercise are best for you. °· Keep all follow-up and prenatal visits as told by your health care provider. This is important. °How is this prevented? °There is no known way of preventing preeclampsia or eclampsia from developing. However, to lower your risk of complications and detect problems early: °· Get regular prenatal care. Your health care provider may be able to diagnose and treat the condition early. °· Maintain a healthy weight. Ask your health care provider for help managing weight gain during pregnancy. °· Work with your health care provider to manage any long-term (chronic) health conditions you have, such as diabetes or kidney problems. °· You may have tests of your blood pressure and kidney function after giving birth. °· Your health care provider may have you take low-dose aspirin during your next pregnancy. °Contact a health care provider if: °· You have symptoms that your health care  provider told you may require more treatment or monitoring, such as: °? Headaches. °? Nausea or vomiting. °? Abdominal pain. °? Dizziness. °? Light-headedness. °Get help right away if: °· You have severe: °? Abdominal pain. °? Headaches that do not get better. °? Dizziness. °? Vision problems. °? Confusion. °? Nausea or vomiting. °· You have any of the following: °? A seizure. °? Sudden, rapid weight gain. °? Sudden swelling in your hands, ankles, or face. °? Trouble moving any part of your body. °? Numbness in any part of your body. °? Trouble speaking. °? Abnormal bleeding. °· You faint. °Summary °· Preeclampsia is a serious condition that may develop during pregnancy. °· This condition causes high blood pressure and increased protein in your urine along with other symptoms, such as headaches and vision changes. °· Diagnosing and treating preeclampsia early is very important. If not treated early,   it can cause serious problems for you and your baby. °· Get help right away if you have symptoms that your health care provider told you to watch for. °This information is not intended to replace advice given to you by your health care provider. Make sure you discuss any questions you have with your health care provider. °Document Released: 11/11/2000 Document Revised: 07/17/2018 Document Reviewed: 06/20/2016 °Elsevier Patient Education © 2020 Elsevier Inc. °Postpartum Care After Vaginal Delivery °This sheet gives you information about how to care for yourself from the time you deliver your baby to up to 6-12 weeks after delivery (postpartum period). Your health care provider may also give you more specific instructions. If you have problems or questions, contact your health care provider. °Follow these instructions at home: °Vaginal bleeding °· It is normal to have vaginal bleeding (lochia) after delivery. Wear a sanitary pad for vaginal bleeding and discharge. °? During the first week after delivery, the amount and  appearance of lochia is often similar to a menstrual period. °? Over the next few weeks, it will gradually decrease to a dry, yellow-brown discharge. °? For most women, lochia stops completely by 4-6 weeks after delivery. Vaginal bleeding can vary from woman to woman. °· Change your sanitary pads frequently. Watch for any changes in your flow, such as: °? A sudden increase in volume. °? A change in color. °? Large blood clots. °· If you pass a blood clot from your vagina, save it and call your health care provider to discuss. Do not flush blood clots down the toilet before talking with your health care provider. °· Do not use tampons or douches until your health care provider says this is safe. °· If you are not breastfeeding, your period should return 6-8 weeks after delivery. If you are feeding your child breast milk only (exclusive breastfeeding), your period may not return until you stop breastfeeding. °Perineal care °· Keep the area between the vagina and the anus (perineum) clean and dry as told by your health care provider. Use medicated pads and pain-relieving sprays and creams as directed. °· If you had a cut in the perineum (episiotomy) or a tear in the vagina, check the area for signs of infection until you are healed. Check for: °? More redness, swelling, or pain. °? Fluid or blood coming from the cut or tear. °? Warmth. °? Pus or a bad smell. °· You may be given a squirt bottle to use instead of wiping to clean the perineum area after you go to the bathroom. As you start healing, you may use the squirt bottle before wiping yourself. Make sure to wipe gently. °· To relieve pain caused by an episiotomy, a tear in the vagina, or swollen veins in the anus (hemorrhoids), try taking a warm sitz bath 2-3 times a day. A sitz bath is a warm water bath that is taken while you are sitting down. The water should only come up to your hips and should cover your buttocks. °Breast care °· Within the first few days  after delivery, your breasts may feel heavy, full, and uncomfortable (breast engorgement). Milk may also leak from your breasts. Your health care provider can suggest ways to help relieve the discomfort. Breast engorgement should go away within a few days. °· If you are breastfeeding: °? Wear a bra that supports your breasts and fits you well. °? Keep your nipples clean and dry. Apply creams and ointments as told by your health care provider. °? You   milk. Intimacy and sexuality  Ask your health care provider when you can engage in sexual activity. This may depend on: ? Your risk of infection. ? How fast you are healing. ? Your comfort and desire to engage in sexual activity.  You are able to get pregnant after delivery, even if you have not had your period. If desired, talk with your health care provider about methods of birth control (contraception). Medicines  Take over-the-counter and prescription medicines only as told by your health care provider.  If you were prescribed an antibiotic medicine, take it as told by your health care provider. Do not stop taking the antibiotic even if you start to feel better. Activity  Gradually return to your normal activities as told by your health care provider. Ask your health care provider what activities are safe for you.  Rest as much as possible. Try to rest or take a nap while your baby is sleeping. Eating and drinking   Drink enough fluid to keep your urine pale yellow.  Eat high-fiber foods every day. These may  help prevent or relieve constipation. High-fiber foods include: ? Whole grain cereals and breads. ? Brown rice. ? Beans. ? Fresh fruits and vegetables.  Do not try to lose weight quickly by cutting back on calories.  Take your prenatal vitamins until your postpartum checkup or until your health care provider tells you it is okay to stop. Lifestyle  Do not use any products that contain nicotine or tobacco, such as cigarettes and e-cigarettes. If you need help quitting, ask your health care provider.  Do not drink alcohol, especially if you are breastfeeding. General instructions  Keep all follow-up visits for you and your baby as told by your health care provider. Most women visit their health care provider for a postpartum checkup within the first 3-6 weeks after delivery. Contact a health care provider if:  You feel unable to cope with the changes that your child brings to your life, and these feelings do not go away.  You feel unusually sad or worried.  Your breasts become red, painful, or hard.  You have a fever.  You have trouble holding urine or keeping urine from leaking.  You have little or no interest in activities you used to enjoy.  You have not breastfed at all and you have not had a menstrual period for 12 weeks after delivery.  You have stopped breastfeeding and you have not had a menstrual period for 12 weeks after you stopped breastfeeding.  You have questions about caring for yourself or your baby.  You pass a blood clot from your vagina. Get help right away if:  You have chest pain.  You have difficulty breathing.  You have sudden, severe leg pain.  You have severe pain or cramping in your lower abdomen.  You bleed from your vagina so much that you fill more than one sanitary pad in one hour. Bleeding should not be heavier than your heaviest period.  You develop a severe headache.  You faint.  You have blurred vision or spots in your  vision.  You have bad-smelling vaginal discharge.  You have thoughts about hurting yourself or your baby. If you ever feel like you may hurt yourself or others, or have thoughts about taking your own life, get help right away. You can go to the nearest emergency department or call:  Your local emergency services (911 in the [place]  A suicide crisis helpline, such as the [place]  fill more than one sanitary pad in one  hour. Bleeding should not be heavier than your heaviest period. °· You develop a severe headache. °· You faint. °· You have blurred vision or spots in your vision. °· You have bad-smelling vaginal discharge. °· You have thoughts about hurting yourself or your baby. °If you ever feel like you may hurt yourself or others, or have thoughts about taking your own life, get help right away. You can go to the nearest emergency department or call: °· Your local emergency services (911 in the [place] °· A suicide crisis helpline, such as the National Suicide Prevention Lifeline at 1-800-273-8255. This is open 24 hours a day. °Summary °· The period of time right after you deliver your newborn up to 6-12 weeks after delivery is called the postpartum period. °· Gradually return to your normal activities as told by your health care provider. °· Keep all follow-up visits for you and your baby as told by your health care provider. °This information is not intended to replace advice given to you by your health care provider. Make sure you discuss any questions you have with your health care provider. °Document Released: 09/11/2007 Document Revised: 11/17/2017 Document Reviewed: 08/28/2017 °Elsevier Patient Education © 2020 Elsevier Inc. ° °

## 2019-06-01 NOTE — Progress Notes (Addendum)
10:14am-CSW spoke with [facility] CPS Worker Wes and was advised that case has not yet been assigned. Per Gwynneth Macleod, case will likely be assigned on  Monday. CSW updated that if MOB is discharged  home-CSW could follow wu with her then. CSW confirmed  with MOB that she does have transportation to visit with  infant if desired.   At this time MOB expressed to [place] she is still undecided on what to do. CSW assured MOB that this is a hard decision and that MOB should take the time needed to make the best decision for herself and infant. MOB again thanked CSW.   CSW went to follow back up with MOB at bedside. Mob was sitting up in bed watching TV. CSW offered further support to [facility] regarding decision on placing infant up for adoption or keeping infant. MOB reported that she  was still unsure but had plans to call Adoption Agencies in the  area to see what they had to say. CSW was very understanding of this. MOB reported a desire to see infant but still expressed being  very scared and nervous to do so. CSW reassured MOB that if she wanted to see infant she was able to and that CSW would be willing to go with her for support if needed. MOB thanked CSW and reported that she was advised that she may be being discharged today. MOB sought further details from [place] on CPS case. CSW advised MOB that at this time CSW made report but is unsure as to who case has been assigned to. CSW notified MOB that [place] would follow up with [facility] CPS to see fifcase was assigned yet. CSW informed MOB that if case is assigned and MOB is no longer here then CPS would likely reach out to her to completed needed task. MOB understating.    MOB reports that she has been thinking of naming infant Glennon Mac but not sure. MOB reported that everything is feeling unreal to her bust she is happy that infant is doing okay. CSW advised MOB that CSW would check in with her once CPS has followed back up with CSW. For updates. Mob understanding and  expressed no further questions to CSW.         Virgie Dad Joetta Delprado, MSW, LCSW-[facility] [place] [facility] [place] [phone]

## 2019-06-01 NOTE — Progress Notes (Signed)
Discharge instructions and prescriptions given to pt. Educated pt on postpartum depression, post-vaginal delivery care, pre-eclampsia, signs and symptoms to report, upcoming appointments, and meds. Pt verbalizes understanding and has no questions at this time. RN verified with social worker that there were no barriers to pt being discharged. Pt discharged from hospital in stable condition.

## 2019-06-04 LAB — GC/CHLAMYDIA PROBE AMP (~~LOC~~) NOT AT ARMC
Chlamydia: NEGATIVE
Neisseria Gonorrhea: NEGATIVE

## 2019-06-24 ENCOUNTER — Telehealth: Payer: Self-pay | Admitting: General Practice

## 2019-06-24 ENCOUNTER — Encounter: Payer: Self-pay | Admitting: General Practice

## 2019-06-24 NOTE — Telephone Encounter (Signed)
Attempted to call pt to schedule PP visit and blood pressure check.  No answer; will try again at a later time.

## 2019-06-24 NOTE — Telephone Encounter (Signed)
Called patient 2x to schedule Postpartum appt.  Pt did not answer and was unable to leave a message for patient.  Will mail a letter for pt to return call to schedule.

## 2021-07-13 ENCOUNTER — Emergency Department (HOSPITAL_COMMUNITY)
Admission: EM | Admit: 2021-07-13 | Discharge: 2021-07-13 | Disposition: A | Discharge status: Home / Self Care | Payer: Medicaid Other | Attending: Emergency Medicine | Admitting: Emergency Medicine

## 2021-07-13 ENCOUNTER — Encounter (HOSPITAL_COMMUNITY): Payer: Self-pay | Admitting: Emergency Medicine

## 2021-07-13 ENCOUNTER — Other Ambulatory Visit: Payer: Self-pay

## 2021-07-13 DIAGNOSIS — N9489 Other specified conditions associated with female genital organs and menstrual cycle: Secondary | ICD-10-CM | POA: Diagnosis not present

## 2021-07-13 DIAGNOSIS — Z5321 Procedure and treatment not carried out due to patient leaving prior to being seen by health care provider: Secondary | ICD-10-CM | POA: Diagnosis not present

## 2021-07-13 DIAGNOSIS — L299 Pruritus, unspecified: Secondary | ICD-10-CM | POA: Diagnosis not present

## 2021-07-13 DIAGNOSIS — R21 Rash and other nonspecific skin eruption: Secondary | ICD-10-CM | POA: Insufficient documentation

## 2021-07-13 LAB — BASIC METABOLIC PANEL
Anion gap: 7 (ref 5–15)
BUN: 7 mg/dL (ref 6–20)
CO2: 27 mmol/L (ref 22–32)
Calcium: 9.2 mg/dL (ref 8.9–10.3)
Chloride: 104 mmol/L (ref 98–111)
Creatinine, Ser: 0.81 mg/dL (ref 0.44–1.00)
GFR, Estimated: >60 mL/min (ref >60)
Glucose, Bld: 113 mg/dL — ABNORMAL HIGH (ref 70–99)
Potassium: 3.7 mmol/L (ref 3.5–5.1)
Sodium: 138 mmol/L (ref 135–145)

## 2021-07-13 LAB — CBC WITH DIFFERENTIAL/PLATELET
Abs Immature Granulocytes: 0.02 10*3/uL (ref 0.00–0.07)
Basophils Absolute: 0.1 10*3/uL (ref 0.0–0.1)
Basophils Relative: 1 %
Eosinophils Absolute: 0.1 10*3/uL (ref 0.0–0.5)
Eosinophils Relative: 2 %
HCT: 33.8 % — ABNORMAL LOW (ref 36.0–46.0)
Hemoglobin: 10.3 g/dL — ABNORMAL LOW (ref 12.0–15.0)
Immature Granulocytes: 0 %
Lymphocytes Relative: 26 %
Lymphs Abs: 1.7 10*3/uL (ref 0.7–4.0)
MCH: 24.3 pg — ABNORMAL LOW (ref 26.0–34.0)
MCHC: 30.5 g/dL (ref 30.0–36.0)
MCV: 79.7 fL — ABNORMAL LOW (ref 80.0–100.0)
Monocytes Absolute: 0.5 10*3/uL (ref 0.1–1.0)
Monocytes Relative: 8 %
Neutro Abs: 4 10*3/uL (ref 1.7–7.7)
Neutrophils Relative %: 63 %
Platelets: 333 10*3/uL (ref 150–400)
RBC: 4.24 MIL/uL (ref 3.87–5.11)
RDW: 15.3 % (ref 11.5–15.5)
WBC: 6.5 10*3/uL (ref 4.0–10.5)
nRBC: 0 % (ref 0.0–0.2)

## 2021-07-13 LAB — I-STAT BETA HCG BLOOD, ED (MC, WL, AP ONLY): I-stat hCG, quantitative: <5 m[IU]/mL (ref <5)

## 2021-07-13 MED ORDER — DIPHENHYDRAMINE HCL 25 MG PO CAPS: 25.0000 mg | ORAL_CAPSULE | Freq: Once | ORAL | Status: DC

## 2021-07-13 NOTE — ED Triage Notes (Signed)
Pt has rash all over her body, w/ the exception of hands.  Pt denies any other symptoms such as fever, chills, muscle aches and fatigue.

## 2021-07-13 NOTE — ED Provider Notes (Signed)
Emergency Medicine Provider Triage Evaluation Note  Lacey Ford , a 35 y.o. female  was evaluated in triage.  Pt complains of rash that began 3 days ago.  She first noticed lesions on her right arm, but now the rash has spread to her bilateral arms, bilateral legs, and trunk.  Her face is spared.  The palms and soles are spared.  She does report that she had chills 1 day within the last 2 weeks, but no fever or other flulike illness.  She was concerned it may be scabies after recent touching her symptoms on the Internet, but she has not noticed any lesions between her fingers.  She characterizes the pain as burning and reports associated itching.  She states that the rash is seems to worsen and swell when she is taking a hot shower.  No family members at her home have a similar rash.   Review of Systems  Positive: Rash, chills Negative: Fever, myalgias, arthralgias, cough, URI symptoms  Physical Exam  BP 128/89 (BP Location: Left Arm)   Pulse 88   Temp 98.4 F (36.9 C) (Oral)   Resp 18   SpO2 100%  Gen:   Awake, no distress   Resp:  Normal effort  MSK:   Moves extremities without difficulty  Other:  Maculopapular rash that is scattered throughout the patient's arms, legs, and trunk.  The face, palms, and soles are spared.  No umbilicated lesions.  No pustules.  No lymphadenopathy.  Medical Decision Making  Medically screening exam initiated at 5:54 AM.  Appropriate orders placed.  Lacey Ford was informed that the remainder of the evaluation will be completed by another provider, this initial triage assessment does not replace that evaluation, and the importance of remaining in the ED until their evaluation is complete.  35 year old female presenting with a rash for the last 3 days.  Although the rash is diffuse, I have a low suspicion for monkey pox at this time as she did not have a viral prodrome over the last few weeks prior to onset of her rash.  She did have chills at some point, but  those resolved and she had no other associated symptoms.  He also did not have any involvement of the face, which is seen in 95% of cases. Will order basic screening labs.  She will require further work-up and evaluation in the emergency department.   Barkley Boards, PA-C 07/13/21 1610    Glynn Octave, MD 07/13/21 [phone]

## 2021-07-13 NOTE — ED Notes (Signed)
Pt's boyfriend who had been seen and discharged walked outside for a few minutes and then come back inside to get the pt. She went and got in the car with her boyfriend and drove off. Moving pt OTF.

## 2021-07-14 ENCOUNTER — Other Ambulatory Visit: Payer: Self-pay

## 2021-07-14 ENCOUNTER — Encounter (HOSPITAL_COMMUNITY): Payer: Self-pay

## 2021-07-14 ENCOUNTER — Ambulatory Visit (HOSPITAL_COMMUNITY)
Admission: EM | Admit: 2021-07-14 | Discharge: 2021-07-14 | Disposition: A | Discharge status: Home / Self Care | Payer: Medicaid Other | Attending: Medical Oncology | Admitting: Medical Oncology

## 2021-07-14 DIAGNOSIS — R21 Rash and other nonspecific skin eruption: Secondary | ICD-10-CM

## 2021-07-14 MED ORDER — IVERMECTIN 3 MG PO TABS: 200.0000 ug/kg | ORAL_TABLET | Freq: Once | ORAL | 0 refills | Status: AC

## 2021-07-14 MED ORDER — CETIRIZINE HCL 10 MG PO TABS: 10.0000 mg | ORAL_TABLET | Freq: Every day | ORAL | 0 refills | Status: DC

## 2021-07-14 MED ORDER — PERMETHRIN 5 % EX CREA: TOPICAL_CREAM | CUTANEOUS | 0 refills | Status: DC

## 2021-07-14 NOTE — ED Provider Notes (Signed)
[facility]    CSN: [id number] Arrival date & time: 07/14/21  1924      History   Chief Complaint Chief Complaint  Patient presents with   Rash    HPI Lacey Ford is a 35 y.o. female.   HPI  Rash: Pt states that she was riding ina car with a friend. Her friend was itchy and states that she had poison oak. Pt reports that she later became itchy as well and that the bumps look like scabies to her. Bumps are itchy and located on her hands, ankles, abdomen, arms. No troubles breathing, swallowing, fever. She has tried lotion without improvement. No new products, medications, etc.   History reviewed. No pertinent past medical history.  Patient Active Problem List   Diagnosis Date Noted   Preterm delivery, delivered 05/30/2019   Preeclampsia 05/30/2019    History reviewed. No pertinent surgical history.  OB History     Gravida  5   Para  3   Term  2   Preterm  1   AB  1   Living  3      SAB      IAB  1   Ectopic      Multiple      Live Births  3            Home Medications    Prior to Admission medications   Medication Sig Start Date End Date Taking? Authorizing Provider  docusate sodium (COLACE) 100 MG capsule Take 1 capsule (100 mg total) by mouth 2 (two) times daily as needed for mild constipation. 06/01/19   Anyanwu, Jethro Bastos, MD  ibuprofen (ADVIL) 600 MG tablet Take 1 tablet (600 mg total) by mouth every 6 (six) hours as needed. 06/01/19   Anyanwu, Jethro Bastos, MD    Family History Family History  Family history unknown: Yes    Social History Social History   Tobacco Use   Smoking status: Every Day    Packs/day: 1.00    Types: Cigarettes   Smokeless tobacco: Never  Substance Use Topics   Alcohol use: Yes    Comment: Occasional   Drug use: No     Allergies   Patient has no known allergies.   Review of Systems Review of Systems  As stated above in HPI Physical Exam Triage Vital Signs ED Triage Vitals  Enc Vitals  Group     BP 07/14/21 2013 130/90     Pulse Rate 07/14/21 2013 90     Resp 07/14/21 2013 18     Temp 07/14/21 2013 98.5 F (36.9 C)     Temp Source 07/14/21 2013 Oral     SpO2 07/14/21 2013 100 %     Weight --      Height --      Head Circumference --      Peak Flow --      Pain Score 07/14/21 2016 0     Pain Loc --      Pain Edu? --      Excl. in GC? --    No data found.  Updated Vital Signs BP 130/90 (BP Location: Left Arm)   Pulse 90   Temp 98.5 F (36.9 C) (Oral)   Resp 18   LMP 06/24/2021   SpO2 100%   Physical Exam Vitals and nursing note reviewed.  Constitutional:      General: She is not in acute distress.    Appearance: Normal appearance. She  is not ill-appearing, toxic-appearing or diaphoretic.  Skin:    Comments: Scattered raised skin colored to slightly erythematic papules of the body. Most notably on the hands and ankles with few scattered burrows. No sign of infection.   Neurological:     Mental Status: She is alert.     UC Treatments / Results  Labs (all labs ordered are listed, but only abnormal results are displayed) Labs Reviewed - No data to display  EKG   Radiology No results found.  Procedures Procedures (including critical care time)  Medications Ordered in UC Medications - No data to display  Initial Impression / Assessment and Plan / UC Course  I have reviewed the triage vital signs and the nursing notes.  Pertinent labs & imaging results that were available during my care of the patient were reviewed by me and considered in my medical decision making (see chart for details).     New. Treating for scabies. Discussed with patient. If symptoms continue I would recommend dermatology evaluation.  Final Clinical Impressions(s) / UC Diagnoses   Final diagnoses:  None   Discharge Instructions   None    ED Prescriptions   None    PDMP not reviewed this encounter.   Rushie Chestnut, [place] 07/14/21 2106

## 2021-07-14 NOTE — ED Triage Notes (Signed)
Pt presents with itchy rash all over different areas of body for past few days.

## 2022-05-16 ENCOUNTER — Encounter (HOSPITAL_COMMUNITY): Payer: Self-pay | Admitting: *Deleted

## 2022-05-16 ENCOUNTER — Inpatient Hospital Stay (HOSPITAL_COMMUNITY): Payer: Medicaid Other

## 2022-05-16 ENCOUNTER — Observation Stay (HOSPITAL_COMMUNITY)
Admission: AD | Admit: 2022-05-16 | Discharge: 2022-05-17 | Disposition: A | Discharge status: Home / Self Care | Payer: Medicaid Other | Attending: Obstetrics and Gynecology | Admitting: Obstetrics and Gynecology

## 2022-05-16 ENCOUNTER — Other Ambulatory Visit: Payer: Self-pay

## 2022-05-16 ENCOUNTER — Encounter (HOSPITAL_COMMUNITY): Payer: Self-pay | Admitting: Family Medicine

## 2022-05-16 ENCOUNTER — Inpatient Hospital Stay (HOSPITAL_BASED_OUTPATIENT_CLINIC_OR_DEPARTMENT_OTHER): Payer: Medicaid Other

## 2022-05-16 ENCOUNTER — Ambulatory Visit (HOSPITAL_COMMUNITY)
Admission: EM | Admit: 2022-05-16 | Discharge: 2022-05-16 | Disposition: A | Discharge status: Other Acute Inpatient Hospital | Payer: Medicaid Other | Source: Home / Self Care

## 2022-05-16 DIAGNOSIS — O2302 Infections of kidney in pregnancy, second trimester: Principal | ICD-10-CM | POA: Insufficient documentation

## 2022-05-16 DIAGNOSIS — N939 Abnormal uterine and vaginal bleeding, unspecified: Secondary | ICD-10-CM

## 2022-05-16 DIAGNOSIS — E860 Dehydration: Secondary | ICD-10-CM | POA: Insufficient documentation

## 2022-05-16 DIAGNOSIS — O2310 Infections of bladder in pregnancy, unspecified trimester: Secondary | ICD-10-CM

## 2022-05-16 DIAGNOSIS — O0281 Inappropriate change in quantitative human chorionic gonadotropin (hCG) in early pregnancy: Secondary | ICD-10-CM | POA: Insufficient documentation

## 2022-05-16 DIAGNOSIS — R350 Frequency of micturition: Secondary | ICD-10-CM | POA: Insufficient documentation

## 2022-05-16 DIAGNOSIS — O4400 Placenta previa specified as without hemorrhage, unspecified trimester: Secondary | ICD-10-CM

## 2022-05-16 DIAGNOSIS — Z3A25 25 weeks gestation of pregnancy: Secondary | ICD-10-CM

## 2022-05-16 DIAGNOSIS — O4402 Placenta previa specified as without hemorrhage, second trimester: Secondary | ICD-10-CM | POA: Diagnosis not present

## 2022-05-16 DIAGNOSIS — Z20822 Contact with and (suspected) exposure to covid-19: Secondary | ICD-10-CM | POA: Diagnosis not present

## 2022-05-16 DIAGNOSIS — O26899 Other specified pregnancy related conditions, unspecified trimester: Secondary | ICD-10-CM | POA: Diagnosis not present

## 2022-05-16 DIAGNOSIS — O4692 Antepartum hemorrhage, unspecified, second trimester: Secondary | ICD-10-CM | POA: Diagnosis not present

## 2022-05-16 DIAGNOSIS — Z363 Encounter for antenatal screening for malformations: Secondary | ICD-10-CM

## 2022-05-16 DIAGNOSIS — Z3492 Encounter for supervision of normal pregnancy, unspecified, second trimester: Secondary | ICD-10-CM

## 2022-05-16 DIAGNOSIS — Z3A27 27 weeks gestation of pregnancy: Secondary | ICD-10-CM | POA: Insufficient documentation

## 2022-05-16 DIAGNOSIS — O2312 Infections of bladder in pregnancy, second trimester: Secondary | ICD-10-CM | POA: Diagnosis not present

## 2022-05-16 DIAGNOSIS — O99332 Smoking (tobacco) complicating pregnancy, second trimester: Secondary | ICD-10-CM | POA: Insufficient documentation

## 2022-05-16 DIAGNOSIS — O99280 Endocrine, nutritional and metabolic diseases complicating pregnancy, unspecified trimester: Secondary | ICD-10-CM | POA: Diagnosis not present

## 2022-05-16 DIAGNOSIS — Z3A00 Weeks of gestation of pregnancy not specified: Secondary | ICD-10-CM

## 2022-05-16 DIAGNOSIS — O99891 Other specified diseases and conditions complicating pregnancy: Secondary | ICD-10-CM | POA: Diagnosis present

## 2022-05-16 DIAGNOSIS — F17210 Nicotine dependence, cigarettes, uncomplicated: Secondary | ICD-10-CM | POA: Diagnosis not present

## 2022-05-16 DIAGNOSIS — O2392 Unspecified genitourinary tract infection in pregnancy, second trimester: Secondary | ICD-10-CM | POA: Insufficient documentation

## 2022-05-16 DIAGNOSIS — R509 Fever, unspecified: Secondary | ICD-10-CM | POA: Diagnosis present

## 2022-05-16 DIAGNOSIS — N12 Tubulo-interstitial nephritis, not specified as acute or chronic: Secondary | ICD-10-CM | POA: Diagnosis not present

## 2022-05-16 LAB — COMPREHENSIVE METABOLIC PANEL
ALT: 10 U/L (ref 0–44)
AST: 11 U/L — ABNORMAL LOW (ref 15–41)
Albumin: 2.1 g/dL — ABNORMAL LOW (ref 3.5–5.0)
Alkaline Phosphatase: 124 U/L (ref 38–126)
Anion gap: 14 (ref 5–15)
BUN: 12 mg/dL (ref 6–20)
CO2: 18 mmol/L — ABNORMAL LOW (ref 22–32)
Calcium: 9.5 mg/dL (ref 8.9–10.3)
Chloride: 96 mmol/L — ABNORMAL LOW (ref 98–111)
Creatinine, Ser: 1.08 mg/dL — ABNORMAL HIGH (ref 0.44–1.00)
GFR, Estimated: >60 mL/min (ref >60)
Glucose, Bld: 130 mg/dL — ABNORMAL HIGH (ref 70–99)
Potassium: 2.4 mmol/L — CL (ref 3.5–5.1)
Sodium: 128 mmol/L — ABNORMAL LOW (ref 135–145)
Total Bilirubin: 0.4 mg/dL (ref 0.3–1.2)
Total Protein: 5.9 g/dL — ABNORMAL LOW (ref 6.5–8.1)

## 2022-05-16 LAB — WET PREP, GENITAL
Sperm: NONE SEEN
Trich, Wet Prep: NONE SEEN
WBC, Wet Prep HPF POC: >=10 — AB (ref <10)
Yeast Wet Prep HPF POC: NONE SEEN

## 2022-05-16 LAB — TYPE AND SCREEN
ABO/RH(D): A POS
Antibody Screen: NEGATIVE

## 2022-05-16 LAB — POCT URINALYSIS DIPSTICK, ED / UC
Bilirubin Urine: NEGATIVE
Glucose, UA: NEGATIVE mg/dL
Ketones, ur: NEGATIVE mg/dL
Nitrite: NEGATIVE
Protein, ur: 30 mg/dL — AB
Specific Gravity, Urine: 1.015 (ref 1.005–1.030)
Urobilinogen, UA: 0.2 mg/dL (ref 0.0–1.0)
pH: 5.5 (ref 5.0–8.0)

## 2022-05-16 LAB — RESP PANEL BY RT-PCR (FLU A&B, COVID) ARPGX2
Influenza A by PCR: NEGATIVE
Influenza B by PCR: NEGATIVE
SARS Coronavirus 2 by RT PCR: NEGATIVE

## 2022-05-16 LAB — CBG MONITORING, ED: Glucose-Capillary: 134 mg/dL — ABNORMAL HIGH (ref 70–99)

## 2022-05-16 LAB — CBC WITH DIFFERENTIAL/PLATELET
Abs Immature Granulocytes: 0.11 10*3/uL — ABNORMAL HIGH (ref 0.00–0.07)
Basophils Absolute: 0 10*3/uL (ref 0.0–0.1)
Basophils Relative: 0 %
Eosinophils Absolute: 0.1 10*3/uL (ref 0.0–0.5)
Eosinophils Relative: 0 %
HCT: 28 % — ABNORMAL LOW (ref 36.0–46.0)
Hemoglobin: 10.2 g/dL — ABNORMAL LOW (ref 12.0–15.0)
Immature Granulocytes: 1 %
Lymphocytes Relative: 7 %
Lymphs Abs: 0.9 10*3/uL (ref 0.7–4.0)
MCH: 31.2 pg (ref 26.0–34.0)
MCHC: 36.4 g/dL — ABNORMAL HIGH (ref 30.0–36.0)
MCV: 85.6 fL (ref 80.0–100.0)
Monocytes Absolute: 1.2 10*3/uL — ABNORMAL HIGH (ref 0.1–1.0)
Monocytes Relative: 9 %
Neutro Abs: 10.7 10*3/uL — ABNORMAL HIGH (ref 1.7–7.7)
Neutrophils Relative %: 83 %
Platelets: 193 10*3/uL (ref 150–400)
RBC: 3.27 MIL/uL — ABNORMAL LOW (ref 3.87–5.11)
RDW: 14 % (ref 11.5–15.5)
WBC: 13 10*3/uL — ABNORMAL HIGH (ref 4.0–10.5)
nRBC: 0 % (ref 0.0–0.2)

## 2022-05-16 LAB — HCG, QUANTITATIVE, PREGNANCY: hCG, Beta Chain, Quant, S: 16047 m[IU]/mL — ABNORMAL HIGH (ref <5)

## 2022-05-16 LAB — LACTIC ACID, PLASMA
Lactic Acid, Venous: 2.4 mmol/L (ref 0.5–1.9)
Lactic Acid, Venous: 2 mmol/L (ref 0.5–1.9)

## 2022-05-16 LAB — POC URINE PREG, ED: Preg Test, Ur: POSITIVE — AB

## 2022-05-16 MED ORDER — SODIUM CHLORIDE 0.9 % IV SOLN: 1.0000 g | INTRAVENOUS | Status: DC

## 2022-05-16 MED ORDER — SODIUM CHLORIDE 0.9 % IV SOLN
Freq: Once | INTRAVENOUS | Status: AC
Start: 2022-05-16 — End: 2022-05-16

## 2022-05-16 MED ORDER — METRONIDAZOLE 500 MG PO TABS
500.0000 mg | ORAL_TABLET | Freq: Two times a day (BID) | ORAL | Status: DC
  Administered 2022-05-16 – 2022-05-17 (×3): 500 mg via ORAL
  Filled 2022-05-16 (×3): qty 1

## 2022-05-16 MED ORDER — ZOLPIDEM TARTRATE 5 MG PO TABS: 5.0000 mg | ORAL_TABLET | Freq: Every evening | ORAL | Status: DC | PRN

## 2022-05-16 MED ORDER — SODIUM CHLORIDE 0.9 % IV SOLN
1.0000 g | Freq: Once | INTRAVENOUS | Status: AC
  Administered 2022-05-16: 1 g via INTRAVENOUS
  Filled 2022-05-16: qty 10

## 2022-05-16 MED ORDER — SODIUM CHLORIDE 0.9 % IV SOLN: INTRAVENOUS | Status: DC

## 2022-05-16 MED ORDER — LIDOCAINE HCL (PF) 1 % IJ SOLN
INTRAMUSCULAR | Status: AC
  Filled 2022-05-16: qty 2

## 2022-05-16 MED ORDER — FAMOTIDINE IN NACL 20-0.9 MG/50ML-% IV SOLN
20.0000 mg | Freq: Once | INTRAVENOUS | Status: AC
  Administered 2022-05-16: 20 mg via INTRAVENOUS
  Filled 2022-05-16: qty 50

## 2022-05-16 MED ORDER — POTASSIUM CHLORIDE CRYS ER 20 MEQ PO TBCR
40.0000 meq | EXTENDED_RELEASE_TABLET | Freq: Three times a day (TID) | ORAL | Status: DC
  Administered 2022-05-16 (×2): 40 meq via ORAL
  Filled 2022-05-16 (×2): qty 2

## 2022-05-16 MED ORDER — DOCUSATE SODIUM 100 MG PO CAPS: 100.0000 mg | ORAL_CAPSULE | Freq: Two times a day (BID) | ORAL | Status: DC | PRN

## 2022-05-16 MED ORDER — CALCIUM CARBONATE ANTACID 500 MG PO CHEW
2.0000 | CHEWABLE_TABLET | ORAL | Status: DC | PRN
Start: 2022-05-16 — End: 2022-05-17
  Administered 2022-05-16: 400 mg via ORAL
  Filled 2022-05-16: qty 2

## 2022-05-16 MED ORDER — PRENATAL MULTIVITAMIN CH
1.0000 | ORAL_TABLET | Freq: Every day | ORAL | Status: DC
  Administered 2022-05-16 – 2022-05-17 (×2): 1 via ORAL
  Filled 2022-05-16 (×2): qty 1

## 2022-05-16 MED ORDER — ACETAMINOPHEN 325 MG PO TABS
650.0000 mg | ORAL_TABLET | ORAL | Status: DC | PRN
  Administered 2022-05-16: 650 mg via ORAL
  Filled 2022-05-16: qty 2

## 2022-05-16 MED ORDER — LORATADINE 10 MG PO TABS
10.0000 mg | ORAL_TABLET | Freq: Every day | ORAL | Status: DC
  Administered 2022-05-16 – 2022-05-17 (×2): 10 mg via ORAL
  Filled 2022-05-16 (×2): qty 1

## 2022-05-16 MED ORDER — CEFTRIAXONE SODIUM 2 G IJ SOLR
2.0000 g | INTRAMUSCULAR | Status: DC
  Administered 2022-05-17: 2 g via INTRAVENOUS
  Filled 2022-05-16 (×2): qty 20

## 2022-05-16 MED ORDER — POTASSIUM CHLORIDE 2 MEQ/ML IV SOLN: INTRAVENOUS | Status: DC

## 2022-05-16 MED ORDER — LACTATED RINGERS IV BOLUS
1000.0000 mL | Freq: Once | INTRAVENOUS | Status: AC
Start: 2022-05-16 — End: 2022-05-16
  Administered 2022-05-16: 1000 mL via INTRAVENOUS

## 2022-05-16 MED ORDER — CEFTRIAXONE SODIUM 1 G IJ SOLR
1.0000 g | Freq: Once | INTRAMUSCULAR | Status: AC
  Administered 2022-05-16: 1 g via INTRAMUSCULAR

## 2022-05-16 MED ORDER — LACTATED RINGERS IV BOLUS
1000.0000 mL | Freq: Once | INTRAVENOUS | Status: AC
  Administered 2022-05-16: 1000 mL via INTRAVENOUS

## 2022-05-16 MED ORDER — CEFTRIAXONE SODIUM 1 G IJ SOLR
INTRAMUSCULAR | Status: AC
  Filled 2022-05-16: qty 10

## 2022-05-16 NOTE — MAU Note (Signed)
CRITICAL VALUE STICKER  CRITICAL VALUE: lactic acid 2.4  RECEIVER (on-site recipient of call): Trystan Akhtar rn  DATE & TIME NOTIFIED: 05/16/2022 1155  MESSENGER (representative from lab): Tobi Bastos  MD NOTIFIED: sam weinhold cnm  TIME OF NOTIFICATION: 05/16/2022 1155  RESPONSE:  no new orders

## 2022-05-16 NOTE — ED Notes (Signed)
[facility] is route. Charge nurse Joni Reining) from MAU notified of patient in route

## 2022-05-16 NOTE — MAU Note (Signed)
CRITICAL VALUE STICKER  CRITICAL VALUE: potassium 2.4  RECEIVER (on-site recipient of call): Josie Burleigh rn  DATE & TIME NOTIFIED: 05/16/2022 1219  MESSENGER (representative from lab): Tobi Bastos  MD NOTIFIED: sam weinhold cnm  TIME OF NOTIFICATION: 05/16/2022 1220  RESPONSE:  no new orders

## 2022-05-16 NOTE — H&P (Signed)
LABOR AND DELIVERY ADMISSION HISTORY AND PHYSICAL NOTE  Lacey Ford is a 36 y.o. female [phone] with confirmed IUP measuring [redacted]w[redacted]d. She presented to MAU via [facility] from [facility].Her chief complaints are fever, chills, occasional cough, intermittent back pain and vaginal spotting. All symptoms are new onset three days ago. Patient states she had identical symptoms two weeks ago, symptoms resolved without intervention. Patient denies sick contacts. She has not received seasonal flu vaccine and has not received   Patient also reported vaginal spotting in the setting of positive home pregnancy test within the past week. On arrival to MAU patient denies all urinary complaints including frequency, dysuria, flank pain, hematuria. She reports maximum fever at home of 104 degrees but also states she does not own a thermometer and so is not sure of the exact reading.   Prenatal History/Complications: - No prenatal care - Sono:  @[redacted]w[redacted]d , CWD, normal anatomy, breech presentation, placenta previa, 40%ile, EFW 865g  Pregnancy complications:  - Placenta previa - AMA (age 77) - No prenatal care  Past Medical History: History reviewed. No pertinent past medical history.  Past Surgical History: History reviewed. No pertinent surgical history.  Obstetrical History: OB History     Gravida  7   Para  3   Term  2   Preterm  1   AB  3   Living  3      SAB  2   IAB  1   Ectopic      Multiple      Live Births  3           Social History: Social History   Socioeconomic History   Marital status: Single    Spouse name: Not on file   Number of children: Not on file   Years of education: Not on file   Highest education level: Not on file  Occupational History   Not on file  Tobacco Use   Smoking status: Every Day    Packs/day: 1.00    Types: Cigarettes   Smokeless tobacco: Never  Substance and Sexual Activity   Alcohol use: Yes    Comment: Occasional   Drug use: No    Sexual activity: Yes    Birth control/protection: Implant  Other Topics Concern   Not on file  Social History Narrative   Not on file   Social Determinants of Health   Financial Resource Strain: Not on file  Food Insecurity: Not on file  Transportation Needs: Not on file  Physical Activity: Not on file  Stress: Not on file  Social Connections: Not on file    Family History: Family History  Problem Relation Age of Onset   Epilepsy Mother     Allergies: No Known Allergies  Medications Prior to Admission  Medication Sig Dispense Refill Last Dose   cetirizine (ZYRTEC ALLERGY) 10 MG tablet Take 1 tablet (10 mg total) by mouth at bedtime. 30 tablet 0    docusate sodium (COLACE) 100 MG capsule Take 1 capsule (100 mg total) by mouth 2 (two) times daily as needed for mild constipation. 10 capsule 1    ibuprofen (ADVIL) 600 MG tablet Take 1 tablet (600 mg total) by mouth every 6 (six) hours as needed. 30 tablet 2    permethrin (ELIMITE) 5 % cream Apply from neck to toes once leaving on for 8- 12 hours then wash off. Repeat in 1 week 60 g 0      Review of Systems  Constitutional:  Positive for chills and fever.  Genitourinary:  Positive for frequency.  Neurological:  Positive for weakness.  All other systems reviewed and are negative.     Physical Exam Vitals and nursing note reviewed. Exam conducted with a chaperone present.  Constitutional:      Appearance: She is ill-appearing.  Cardiovascular:     Rate and Rhythm: Tachycardia present.     Pulses: Normal pulses.     Heart sounds: Normal heart sounds.  Pulmonary:     Effort: Pulmonary effort is normal.     Breath sounds: Normal breath sounds.  Abdominal:     Tenderness: There is no abdominal tenderness. There is no right CVA tenderness, left CVA tenderness, guarding or rebound.     Comments: Gravid  Skin:    General: Skin is dry.     Capillary Refill: Capillary refill takes less than 2 seconds.     Coloration:  Skin is pale.  Neurological:     Mental Status: She is oriented to person, place, and time.  Psychiatric:        Mood and Affect: Mood normal.        Behavior: Behavior normal.        Thought Content: Thought content normal.        Judgment: Judgment normal.     Blood pressure 95/63, pulse 80, temperature (!) 97.5 F (36.4 C), temperature source Rectal, resp. rate 16, SpO2 100 %, unknown if currently breastfeeding.   Prenatal labs: ABO, Rh: --/--/A POS (06/19 1100) Antibody: NEG (06/19 1100)   Prenatal Transfer Tool  Maternal Diabetes: Unknown (no prenatal care as of admission) Genetic Screening: Declined Maternal Ultrasounds/Referrals: Other: Placenta previa Fetal Ultrasounds or other Referrals:  None Maternal Substance Abuse:  Unknown Significant Maternal Medications:  None Significant Maternal Lab Results: None   Patient Vitals for the past 24 hrs:  BP Temp Temp src Pulse Resp SpO2  05/16/22 1316 99/65 -- -- 83 -- 100 %  05/16/22 1301 101/69 -- -- 83 -- 100 %  05/16/22 1246 102/68 -- -- 83 -- 100 %  05/16/22 1231 95/63 -- -- 80 -- 100 %  05/16/22 1216 (!) 74/56 -- -- 85 -- 100 %  05/16/22 1201 (!) 84/50 -- -- 80 -- 100 %  05/16/22 1146 (!) 93/55 -- -- 88 -- 100 %  05/16/22 1131 (!) 99/59 -- -- 85 -- 100 %  05/16/22 1124 -- (!) 97.5 F (36.4 C) Rectal -- -- --  05/16/22 1116 (!) 89/52 -- -- 85 -- 100 %  05/16/22 1109 (!) 92/53 -- -- 81 -- --  05/16/22 1050 (!) 85/50 (!) 94.5 F (34.7 C) Axillary 86 16 100 %    Results for orders placed or performed during the hospital encounter of 05/16/22 (from the past 24 hour(s))  Resp Panel by RT-PCR (Flu A&B, Covid) Anterior Nasal Swab   Collection Time: 05/16/22 10:52 AM   Specimen: Anterior Nasal Swab  Result Value Ref Range   SARS Coronavirus 2 by RT PCR NEGATIVE NEGATIVE   Influenza A by PCR NEGATIVE NEGATIVE   Influenza B by PCR NEGATIVE NEGATIVE  CBC with Differential/Platelet   Collection Time: 05/16/22 11:00 AM   Result Value Ref Range   WBC 13.0 (H) 4.0 - 10.5 K/uL   RBC 3.27 (L) 3.87 - 5.11 MIL/uL   Hemoglobin 10.2 (L) 12.0 - 15.0 g/dL   HCT 93.2 (L) 35.5 - 73.2 %   MCV 85.6 80.0 - 100.0 fL   MCH 31.2  26.0 - 34.0 pg   MCHC 36.4 (H) 30.0 - 36.0 g/dL   RDW 56.2 13.0 - 86.5 %   Platelets 193 150 - 400 K/uL   nRBC 0.0 0.0 - 0.2 %   Neutrophils Relative % 83 %   Neutro Abs 10.7 (H) 1.7 - 7.7 K/uL   Lymphocytes Relative 7 %   Lymphs Abs 0.9 0.7 - 4.0 K/uL   Monocytes Relative 9 %   Monocytes Absolute 1.2 (H) 0.1 - 1.0 K/uL   Eosinophils Relative 0 %   Eosinophils Absolute 0.1 0.0 - 0.5 K/uL   Basophils Relative 0 %   Basophils Absolute 0.0 0.0 - 0.1 K/uL   RBC Morphology MORPHOLOGY UNREMARKABLE    Immature Granulocytes 1 %   Abs Immature Granulocytes 0.11 (H) 0.00 - 0.07 K/uL  Comprehensive metabolic panel   Collection Time: 05/16/22 11:00 AM  Result Value Ref Range   Sodium 128 (L) 135 - 145 mmol/L   Potassium 2.4 (LL) 3.5 - 5.1 mmol/L   Chloride 96 (L) 98 - 111 mmol/L   CO2 18 (L) 22 - 32 mmol/L   Glucose, Bld 130 (H) 70 - 99 mg/dL   BUN 12 6 - 20 mg/dL   Creatinine, Ser 7.84 (H) 0.44 - 1.00 mg/dL   Calcium 9.5 8.9 - 69.6 mg/dL   Total Protein 5.9 (L) 6.5 - 8.1 g/dL   Albumin 2.1 (L) 3.5 - 5.0 g/dL   AST 11 (L) 15 - 41 U/L   ALT 10 0 - 44 U/L   Alkaline Phosphatase 124 38 - 126 U/L   Total Bilirubin 0.4 0.3 - 1.2 mg/dL   GFR, Estimated >29 >52 mL/min   Anion gap 14 5 - 15  Lactic acid, plasma   Collection Time: 05/16/22 11:00 AM  Result Value Ref Range   Lactic Acid, Venous 2.4 (HH) 0.5 - 1.9 mmol/L  hCG, quantitative, pregnancy   Collection Time: 05/16/22 11:00 AM  Result Value Ref Range   hCG, Beta Chain, Quant, S 16,047 (H) <5 mIU/mL  Type and screen [facility]   Collection Time: 05/16/22 11:00 AM  Result Value Ref Range   ABO/RH(D) A POS    Antibody Screen NEG    Sample Expiration      05/19/2022,2359 Performed at [facility], [address] [place], [place] [address]   Wet prep, genital   Collection Time: 05/16/22 11:50 AM  Result Value Ref Range   Yeast Wet Prep HPF POC NONE SEEN NONE SEEN   Trich, Wet Prep NONE SEEN NONE SEEN   Clue Cells Wet Prep HPF POC PRESENT (A) NONE SEEN   WBC, Wet Prep HPF POC >=10 (A) <10   Sperm NONE SEEN   Results for orders placed or performed during the hospital encounter of 05/16/22 (from the past 24 hour(s))  POCT Urinalysis Dipstick (ED/UC)   Collection Time: 05/16/22  8:35 AM  Result Value Ref Range   Glucose, UA NEGATIVE NEGATIVE mg/dL   Bilirubin Urine NEGATIVE NEGATIVE   Ketones, ur NEGATIVE NEGATIVE mg/dL   Specific Gravity, Urine 1.015 1.005 - 1.030   Hgb urine dipstick MODERATE (A) NEGATIVE   pH 5.5 5.0 - 8.0   Protein, ur 30 (A) NEGATIVE mg/dL   Urobilinogen, UA 0.2 0.0 - 1.0 mg/dL   Nitrite NEGATIVE NEGATIVE   Leukocytes,Ua LARGE (A) NEGATIVE  POC urine preg, ED (not at [facility])   Collection Time: 05/16/22  8:45 AM  Result Value Ref Range  Preg Test, Ur POSITIVE (A) NEGATIVE  POC CBG monitoring   Collection Time: 05/16/22  9:33 AM  Result Value Ref Range   Glucose-Capillary 134 (H) 70 - 99 mg/dL    Patient Active Problem List   Diagnosis Date Noted   Preterm delivery, delivered 05/30/2019   Preeclampsia 05/30/2019    Assessment: --Merian Wroe is a 36 y.o. [phone] measuring 25w 6d with fever or unknown origin --Placenta Previa --S/p 1g Rocephin IM at Urgent Care prior to transfer --S/p 1g Rocephin IV in MAU --Negative COVID/Flu Swab --Lactic acid 2.4, redraw scheduled for 2pm (3 hours from previous) --Per Dr. Para March, place in observation on OBSC  Calvert Cantor, [place], MSN, CNM 05/16/2022, 1:45 PM

## 2022-05-16 NOTE — ED Triage Notes (Signed)
Pt reports HA.Fever and body aches for 3 days. Pt denies vomiying or diarrhea.

## 2022-05-16 NOTE — ED Notes (Signed)
Patient is being discharged from the Urgent Care and sent to the Emergency Department via [facility] . Per Juliet Rude NP , patient is in need of higher level of care due to general weakness, UTI, fever, pregnancy, light spotting,and dehydration. Patient is aware and verbalizes understanding of plan of care.  Vitals:   05/16/22 0826 05/16/22 0828  BP: (!) 101/59   Pulse: (!) 117   Temp:  (!) 97.5 F (36.4 C)  SpO2: 97%

## 2022-05-16 NOTE — ED Provider Notes (Addendum)
[facility]    CSN: [id number] Arrival date & time: 05/16/22  3151      History   Chief Complaint Chief Complaint  Patient presents with   Fever   Headache   Generalized Body Aches    HPI Lacey Ford is a 36 y.o. female.   Patient presents urgent care for evaluation of headache, fever/chills, and generalized weakness for the last 3 days.  She states that she had similar symptoms approximately 1 month ago that went away on their own.  Denies nausea, vomiting, abdominal pain, constipation, diarrhea, dizziness, blurry vision, decreased visual acuity, and photophobia.  She states that her headache is worse when she coughs.  Cough is dry and without bloody sputum.  Patient states that she took ibuprofen and Tylenol this morning and attempt to lower her temperature.  She is also reporting generalized body aches and back pain.  Denies sick contacts. Also states that she has been taking hot showers to attempt to lower her fever at home.  She does not have a thermometer at home and is unable to report numerical value for highest fever at home but reports chills.  Patient took a pregnancy test at home which resulted positive 3 days ago.  Last menstrual period was "a couple of months ago".  She reports a small amount of vaginal bleeding last night that she states was similar to spotting during a menstrual cycle.  Denies vaginal bleeding since then and denies abdominal pain.  Reports urinary frequency over the last 3 days, but denies dysuria, and urgency.  She states that she has been drinking "a lot of water" and still feels very dehydrated with a dry mouth.  Denies history of gestational diabetes or type 2 diabetes.  Denies past history of complications related to pregnancy.    Fever Associated symptoms: headaches   Headache Associated symptoms: fever     History reviewed. No pertinent past medical history.  Patient Active Problem List   Diagnosis Date Noted   Preterm delivery,  delivered 05/30/2019   Preeclampsia 05/30/2019    History reviewed. No pertinent surgical history.  OB History     Gravida  5   Para  3   Term  2   Preterm  1   AB  1   Living  3      SAB      IAB  1   Ectopic      Multiple      Live Births  3            Home Medications    Prior to Admission medications   Medication Sig Start Date End Date Taking? Authorizing Provider  cetirizine (ZYRTEC ALLERGY) 10 MG tablet Take 1 tablet (10 mg total) by mouth at bedtime. 07/14/21   Rushie Chestnut, PA-C  docusate sodium (COLACE) 100 MG capsule Take 1 capsule (100 mg total) by mouth 2 (two) times daily as needed for mild constipation. 06/01/19   Anyanwu, Jethro Bastos, MD  ibuprofen (ADVIL) 600 MG tablet Take 1 tablet (600 mg total) by mouth every 6 (six) hours as needed. 06/01/19   Anyanwu, Jethro Bastos, MD  permethrin (ELIMITE) 5 % cream Apply from neck to toes once leaving on for 8- 12 hours then wash off. Repeat in 1 week 07/14/21   Karen Kitchens    Family History Family History  Family history unknown: Yes    Social History Social History   Tobacco Use   Smoking  status: Every Day    Packs/day: 1.00    Types: Cigarettes   Smokeless tobacco: Never  Substance Use Topics   Alcohol use: Yes    Comment: Occasional   Drug use: No     Allergies   Patient has no known allergies.   Review of Systems Review of Systems  Constitutional:  Positive for fever.  Neurological:  Positive for headaches.  Per HPI   Physical Exam Triage Vital Signs ED Triage Vitals  Enc Vitals Group     BP 05/16/22 0826 (!) 101/59     Pulse Rate 05/16/22 0826 (!) 117     Resp --      Temp 05/16/22 0828 (!) 97.5 F (36.4 C)     Temp src --      SpO2 05/16/22 0826 97 %     Weight --      Height --      Head Circumference --      Peak Flow --      Pain Score 05/16/22 0829 8     Pain Loc --      Pain Edu? --      Excl. in GC? --    No data found.  Updated Vital  Signs BP (!) 101/59   Pulse (!) 117   Temp (!) 97.5 F (36.4 C)   LMP  (LMP Unknown) Comment: PT took a pregancey test 4 days ago with positive results.  SpO2 97%   Visual Acuity Right Eye Distance:   Left Eye Distance:   Bilateral Distance:    Right Eye Near:   Left Eye Near:    Bilateral Near:     Physical Exam Vitals and nursing note reviewed.  Constitutional:      Appearance: Normal appearance. She is ill-appearing and diaphoretic. She is not toxic-appearing.     Comments: Very pleasant patient sitting on exam in position of comfort table in no acute distress.   HENT:     Head: Normocephalic and atraumatic.     Right Ear: Hearing and external ear normal.     Left Ear: Hearing and external ear normal.     Nose: Nose normal.     Mouth/Throat:     Lips: Pink.     Mouth: Mucous membranes are dry.     Pharynx: No posterior oropharyngeal erythema.  Eyes:     General: Lids are normal. Vision grossly intact. Gaze aligned appropriately.     Extraocular Movements: Extraocular movements intact.     Conjunctiva/sclera: Conjunctivae normal.     Pupils: Pupils are equal, round, and reactive to light.  Cardiovascular:     Rate and Rhythm: Normal rate and regular rhythm.     Heart sounds: Normal heart sounds, S1 normal and S2 normal.  Pulmonary:     Effort: Pulmonary effort is normal. No respiratory distress.     Breath sounds: Normal breath sounds and air entry.  Abdominal:     General: Bowel sounds are normal.     Palpations: Abdomen is soft.     Tenderness: There is no abdominal tenderness.  Musculoskeletal:     Cervical back: Neck supple.  Skin:    General: Skin is warm and moist.     Capillary Refill: Capillary refill takes less than 2 seconds.     Coloration: Skin is pale.     Findings: No rash.     Comments: Skin is clammy to touch.  Neurological:     General: No focal deficit  present.     Mental Status: She is alert and oriented to person, place, and time. Mental  status is at baseline.     Cranial Nerves: No dysarthria or facial asymmetry.     Motor: Motor function is intact.     Coordination: Coordination is intact.     Gait: Gait is intact. Gait normal.     Comments: 5/5 strength to bilateral upper and lower extremities against resistance with abduction and abduction.  5/5 grip strength bilaterally.   Psychiatric:        Mood and Affect: Mood normal.        Speech: Speech normal.        Behavior: Behavior normal.        Thought Content: Thought content normal.        Judgment: Judgment normal.      UC Treatments / Results  Labs (all labs ordered are listed, but only abnormal results are displayed) Labs Reviewed  POCT URINALYSIS DIPSTICK, ED / UC - Abnormal; Notable for the following components:      Result Value   Hgb urine dipstick MODERATE (*)    Protein, ur 30 (*)    Leukocytes,Ua LARGE (*)    All other components within normal limits  POC URINE PREG, ED - Abnormal; Notable for the following components:   Preg Test, Ur POSITIVE (*)    All other components within normal limits  CBG MONITORING, ED - Abnormal; Notable for the following components:   Glucose-Capillary 134 (*)    All other components within normal limits  URINE CULTURE    EKG   Radiology No results found.  Procedures Procedures (including critical care time)  Medications Ordered in UC Medications  cefTRIAXone (ROCEPHIN) injection 1 g (1 g Intramuscular Given 05/16/22 1005)    Initial Impression / Assessment and Plan / UC Course  I have reviewed the triage vital signs and the nursing notes.  Pertinent labs & imaging results that were available during my care of the patient were reviewed by me and considered in my medical decision making (see chart for details).  Positive Pregnancy Test/Acute Cystitis during Pregnancy Patient appears dehydrated with dry oral mucous membranes and pale/clammy skin.  There is no rash.  She is also reporting small amount of  vaginal spotting last night with associated low back pain. Urinalysis in clinic shows moderate hemoglobin, proteinuria, and large leukocytes consistent with acute urinary tract infection.  Due to patient's fatigue, fever/chills, and soft blood pressure, there is slight concern for pyelonephritis/urosepsis in the setting of pregnancy.  Patient is afebrile at this time due to taking ibuprofen and Tylenol approximately 2 hours ago at home for headache and fever.  Patient was unaware that ibuprofen is contraindicated in pregnancy.  She appears very dehydrated and pale to physical exam with dry oral mucosa.  Heart rate 117 with initial vital sign assessment.  Heart rate decreased to 93 with recheck during provider physical exam.  Patient would benefit from further evaluation and assessment in the maternity assessment unit and agrees to go by [facility] transport due to tachycardia, generalized weakness, and soft blood pressure as well as subjective vaginal spotting last night in the setting of advanced maternal age.  1 g Rocephin injection given in clinic to initiate antibiotic therapy in the setting of possible acute pyelonephritis/urosepsis.  Elevated Blood Glucose CBG is 134 in the clinic.  Patient states that she ate a half a bowl of noodles this morning and denies history of diabetes  with and without pregnancy.   Headache Neurologic exam is stable and normal in clinic.  There is no unilateral weakness to bilateral upper or lower extremities and no focal neurologic abnormality.   Patient verbalizes understanding and agreement of plan to transport her to MAU via [facility] transport.  MAU made aware of patient's planned arrival.    Final Clinical Impressions(s) / UC Diagnoses   Final diagnoses:  Acute cystitis during pregnancy, antepartum  Urinary frequency  Vaginal spotting  Dehydration     Discharge Instructions      Please go to MAU for further evaluation.      ED Prescriptions   None     PDMP not reviewed this encounter.   Carlisle Beers, FNP 05/16/22 1015    Carlisle Beers, FNP 05/16/22 1031

## 2022-05-16 NOTE — MAU Note (Signed)
Lacey Ford is a 36 y.o. here in MAU reporting: pt arrived via [facility] from UC reporting fever for 3 days, highest temp was 104. Occasional headache when she coughs and occasional back pain, none currently. Had light spotting yesterday but none today.  LMP: unknown, irregular periods, maybe a couple months ago  Onset of complaint: ongoing for 3 days  Pain score: 0/10  Vitals:   05/16/22 1050  BP: (!) 85/50  Pulse: 86  Resp: 16  Temp: (!) 94.5 F (34.7 C)  SpO2: 100%     Lab orders placed from triage: none

## 2022-05-16 NOTE — Discharge Instructions (Addendum)
Please go to MAU for further evaluation.

## 2022-05-16 NOTE — ED Triage Notes (Signed)
Pt reports a positive home preg. test

## 2022-05-17 ENCOUNTER — Other Ambulatory Visit (HOSPITAL_COMMUNITY): Payer: Self-pay

## 2022-05-17 DIAGNOSIS — R509 Fever, unspecified: Secondary | ICD-10-CM

## 2022-05-17 LAB — CBC
HCT: 31.4 % — ABNORMAL LOW (ref 36.0–46.0)
Hemoglobin: 10.7 g/dL — ABNORMAL LOW (ref 12.0–15.0)
MCH: 29.8 pg (ref 26.0–34.0)
MCHC: 34.1 g/dL (ref 30.0–36.0)
MCV: 87.5 fL (ref 80.0–100.0)
Platelets: 179 10*3/uL (ref 150–400)
RBC: 3.59 MIL/uL — ABNORMAL LOW (ref 3.87–5.11)
RDW: 14.4 % (ref 11.5–15.5)
WBC: 8.4 10*3/uL (ref 4.0–10.5)
nRBC: 0 % (ref 0.0–0.2)

## 2022-05-17 LAB — RPR: RPR Ser Ql: NONREACTIVE

## 2022-05-17 LAB — BASIC METABOLIC PANEL
Anion gap: 12 (ref 5–15)
BUN: 5 mg/dL — ABNORMAL LOW (ref 6–20)
CO2: 18 mmol/L — ABNORMAL LOW (ref 22–32)
Calcium: 8.8 mg/dL — ABNORMAL LOW (ref 8.9–10.3)
Chloride: 104 mmol/L (ref 98–111)
Creatinine, Ser: 0.91 mg/dL (ref 0.44–1.00)
GFR, Estimated: >60 mL/min (ref >60)
Glucose, Bld: 96 mg/dL (ref 70–99)
Potassium: 2.8 mmol/L — ABNORMAL LOW (ref 3.5–5.1)
Sodium: 134 mmol/L — ABNORMAL LOW (ref 135–145)

## 2022-05-17 LAB — GC/CHLAMYDIA PROBE AMP (~~LOC~~) NOT AT ARMC
Chlamydia: NEGATIVE
Comment: NEGATIVE
Comment: NORMAL
Neisseria Gonorrhea: NEGATIVE

## 2022-05-17 LAB — URINE CULTURE

## 2022-05-17 LAB — LACTIC ACID, PLASMA: Lactic Acid, Venous: 1.4 mmol/L (ref 0.5–1.9)

## 2022-05-17 LAB — MAGNESIUM: Magnesium: 1.5 mg/dL — ABNORMAL LOW (ref 1.7–2.4)

## 2022-05-17 LAB — RAPID HIV SCREEN (HIV 1/2 AB+AG)
HIV 1/2 Antibodies: NONREACTIVE
HIV-1 P24 Antigen - HIV24: NONREACTIVE

## 2022-05-17 LAB — HEPATITIS B SURFACE ANTIGEN: Hepatitis B Surface Ag: NONREACTIVE

## 2022-05-17 MED ORDER — PRENATAL MULTIVITAMIN CH
1.0000 | ORAL_TABLET | Freq: Every day | ORAL | 3 refills | Status: DC
  Filled 2022-05-17: qty 30, 30d supply, fill #0

## 2022-05-17 MED ORDER — POTASSIUM CHLORIDE CRYS ER 20 MEQ PO TBCR
40.0000 meq | EXTENDED_RELEASE_TABLET | Freq: Two times a day (BID) | ORAL | 0 refills | Status: DC
  Filled 2022-05-17: qty 8, 2d supply, fill #0

## 2022-05-17 MED ORDER — METRONIDAZOLE 500 MG PO TABS
500.0000 mg | ORAL_TABLET | Freq: Two times a day (BID) | ORAL | 0 refills | Status: AC
  Filled 2022-05-17: qty 12, 6d supply, fill #0

## 2022-05-17 MED ORDER — MAGNESIUM SULFATE 2 GM/50ML IV SOLN
2.0000 g | Freq: Once | INTRAVENOUS | Status: AC
  Administered 2022-05-17: 2 g via INTRAVENOUS
  Filled 2022-05-17: qty 50

## 2022-05-17 MED ORDER — POTASSIUM CHLORIDE CRYS ER 20 MEQ PO TBCR
40.0000 meq | EXTENDED_RELEASE_TABLET | Freq: Three times a day (TID) | ORAL | Status: DC
Start: 2022-05-17 — End: 2022-05-17
  Administered 2022-05-17: 40 meq via ORAL
  Filled 2022-05-17: qty 2

## 2022-05-17 MED ORDER — CEPHALEXIN 250 MG PO CAPS
250.0000 mg | ORAL_CAPSULE | Freq: Every day | ORAL | 3 refills | Status: DC
  Filled 2022-05-17: qty 30, 30d supply, fill #0

## 2022-05-17 MED ORDER — CEFADROXIL 500 MG PO CAPS
500.0000 mg | ORAL_CAPSULE | Freq: Two times a day (BID) | ORAL | 0 refills | Status: DC
  Filled 2022-05-17: qty 14, 7d supply, fill #0

## 2022-05-17 MED ORDER — ACETAMINOPHEN 325 MG PO TABS: 650.0000 mg | ORAL_TABLET | ORAL | Status: DC | PRN

## 2022-05-17 MED ORDER — MAGNESIUM OXIDE -MG SUPPLEMENT 400 (240 MG) MG PO TABS
400.0000 mg | ORAL_TABLET | Freq: Once | ORAL | Status: DC
  Administered 2022-05-17: 400 mg via ORAL
  Filled 2022-05-17: qty 1

## 2022-05-17 NOTE — Discharge Summary (Signed)
Antenatal Physician Discharge Summary  Patient ID: Lacey Ford MRN: [id number] DOB/AGE: [birth year] 36 y.o.  Admit date: 05/16/2022 Discharge date: 05/17/2022  Admission Diagnoses: Fever, unknown origin [R50.9]  Discharge Diagnoses:  Pyelonephritis in pregnancy Placenta previa Pregnancy at 27 weeks   Prenatal Procedures: ultrasound, NST  Consults: None  Hospital Course:  Lacey Ford is a 36 y.o. [id number] with IUP at [redacted]w[redacted]d admitted for initially FUO but ultimately likely diagnosed with pyelonephritis. Pt reported back pain after her symptoms improved on HD2.  She had no labor symptoms but some spotting prior to admission. She had no bleeding during her hospital stay. She had not known she was pregnant prior to admission but is now aware of FM. She felt significantly better on HD2 following antibiotics and IVF. She was observed, fetal heart rate monitoring remained reassuring, and she had no signs/symptoms of infection. Her WBC improved from 13 to 8, Lactic acid had been 2.4 and is now 1.4, she was afebrile, and clinically she felt much better. Urine culture showed E. Coli with sensitivities pending. She still preferred discharge over staying one additional day. She got another dose of Ceftriaxone with discharge to occur after. She was counseled again on the placenta previa, likely need for c-section, and the importance of prenatal care. She thinks she is done having children - reviewed important to follow up in the office so we may sign tubal papers if she desires this. She was deemed stable for discharge to home with outpatient follow up for prenatal care.  A message was sent to [facility] to establish [facility] for the patient.   Discharge Exam: Temp:  [96.2 F (35.7 C)-99.8 F (37.7 C)] 99 F (37.2 C) (06/20 1116) Pulse Rate:  [95-122] 112 (06/20 1116) Resp:  [16-18] 18 (06/20 1116) BP: (110-128)/(61-68) 124/64 (06/20 1116) SpO2:  [99 %-100 %] 100 % (06/20 1116) Weight:  [69.3 kg] 69.3 kg (06/19  1653) Physical Examination: CONSTITUTIONAL: Well-developed, well-nourished female in no acute distress.  HENT:  Normocephalic, atraumatic, External right and left ear normal.  EYES: Conjunctivae and EOM are normal. Pupils are equal, round, and reactive to light. No scleral icterus.  NECK: Normal range of motion, supple, no masses SKIN: Skin is warm and dry. No rash noted. Not diaphoretic. No erythema. No pallor. NEUROLOGIC: Alert and oriented to person, place, and time. Normal reflexes, muscle tone coordination. No cranial nerve deficit noted. PSYCHIATRIC: Normal mood and affect. Normal behavior. Normal judgment and thought content. CARDIOVASCULAR: Normal heart rate noted, regular rhythm RESPIRATORY: Effort and breath sounds normal, no problems with respiration noted MUSCULOSKELETAL: Normal range of motion. No edema and no tenderness. 2+ distal pulses. ABDOMEN: Soft, nontender, nondistended, gravid. CERVIX:  Not examined  Fetal monitoring: FHR: 120 bpm, Variability: moderate, Accelerations: Present 10x10  Decelerations: Absent  Uterine activity: flat  Significant Diagnostic Studies:  Results for orders placed or performed during the hospital encounter of 05/16/22 (from the past 168 hour(s))  Resp Panel by RT-PCR (Flu A&B, Covid) Anterior Nasal Swab   Collection Time: 05/16/22 10:52 AM   Specimen: Anterior Nasal Swab  Result Value Ref Range   SARS Coronavirus 2 by RT PCR NEGATIVE NEGATIVE   Influenza A by PCR NEGATIVE NEGATIVE   Influenza B by PCR NEGATIVE NEGATIVE  CBC with Differential/Platelet   Collection Time: 05/16/22 11:00 AM  Result Value Ref Range   WBC 13.0 (H) 4.0 - 10.5 K/uL   RBC 3.27 (L) 3.87 - 5.11 MIL/uL   Hemoglobin 10.2 (L) 12.0 - 15.0 g/dL  HCT 28.0 (L) 36.0 - 46.0 %   MCV 85.6 80.0 - 100.0 fL   MCH 31.2 26.0 - 34.0 pg   MCHC 36.4 (H) 30.0 - 36.0 g/dL   RDW 60.4 54.0 - 98.1 %   Platelets 193 150 - 400 K/uL   nRBC 0.0 0.0 - 0.2 %   Neutrophils Relative % 83 %    Neutro Abs 10.7 (H) 1.7 - 7.7 K/uL   Lymphocytes Relative 7 %   Lymphs Abs 0.9 0.7 - 4.0 K/uL   Monocytes Relative 9 %   Monocytes Absolute 1.2 (H) 0.1 - 1.0 K/uL   Eosinophils Relative 0 %   Eosinophils Absolute 0.1 0.0 - 0.5 K/uL   Basophils Relative 0 %   Basophils Absolute 0.0 0.0 - 0.1 K/uL   RBC Morphology MORPHOLOGY UNREMARKABLE    Immature Granulocytes 1 %   Abs Immature Granulocytes 0.11 (H) 0.00 - 0.07 K/uL  Comprehensive metabolic panel   Collection Time: 05/16/22 11:00 AM  Result Value Ref Range   Sodium 128 (L) 135 - 145 mmol/L   Potassium 2.4 (LL) 3.5 - 5.1 mmol/L   Chloride 96 (L) 98 - 111 mmol/L   CO2 18 (L) 22 - 32 mmol/L   Glucose, Bld 130 (H) 70 - 99 mg/dL   BUN 12 6 - 20 mg/dL   Creatinine, Ser 1.91 (H) 0.44 - 1.00 mg/dL   Calcium 9.5 8.9 - 47.8 mg/dL   Total Protein 5.9 (L) 6.5 - 8.1 g/dL   Albumin 2.1 (L) 3.5 - 5.0 g/dL   AST 11 (L) 15 - 41 U/L   ALT 10 0 - 44 U/L   Alkaline Phosphatase 124 38 - 126 U/L   Total Bilirubin 0.4 0.3 - 1.2 mg/dL   GFR, Estimated >29 >56 mL/min   Anion gap 14 5 - 15  Lactic acid, plasma   Collection Time: 05/16/22 11:00 AM  Result Value Ref Range   Lactic Acid, Venous 2.4 (HH) 0.5 - 1.9 mmol/L  hCG, quantitative, pregnancy   Collection Time: 05/16/22 11:00 AM  Result Value Ref Range   hCG, Beta Chain, Quant, S 16,047 (H) <5 mIU/mL  Type and screen [facility]   Collection Time: 05/16/22 11:00 AM  Result Value Ref Range   ABO/RH(D) A POS    Antibody Screen NEG    Sample Expiration      05/19/2022,2359 Performed at [facility], [address] [place], [place] [address]   Wet prep, genital   Collection Time: 05/16/22 11:50 AM  Result Value Ref Range   Yeast Wet Prep HPF POC NONE SEEN NONE SEEN   Trich, Wet Prep NONE SEEN NONE SEEN   Clue Cells Wet Prep HPF POC PRESENT (A) NONE SEEN   WBC, Wet Prep HPF POC >=10 (A) <10   Sperm NONE SEEN   Culture, blood (Routine X 2) w Reflex to ID Panel    Collection Time: 05/16/22 12:13 PM   Specimen: BLOOD RIGHT HAND  Result Value Ref Range   Specimen Description BLOOD RIGHT HAND    Special Requests      BOTTLES DRAWN AEROBIC AND ANAEROBIC Blood Culture adequate volume   Culture      NO GROWTH < 24 HOURS Performed at [facility], [address] [place], [place] [address]    Report Status PENDING   Culture, blood (Routine X 2) w Reflex to ID Panel   Collection Time: 05/16/22 12:16 PM   Specimen:  BLOOD LEFT HAND  Result Value Ref Range   Specimen Description BLOOD LEFT HAND    Special Requests      BOTTLES DRAWN AEROBIC AND ANAEROBIC Blood Culture adequate volume   Culture      NO GROWTH < 24 HOURS Performed at [facility], [place] [address], [place], [place] [address]    Report Status PENDING   Lactic acid, plasma   Collection Time: 05/16/22  2:06 PM  Result Value Ref Range   Lactic Acid, Venous 2.0 (HH) 0.5 - 1.9 mmol/L  Hepatitis B surface antigen   Collection Time: 05/17/22  4:43 AM  Result Value Ref Range   Hepatitis B Surface Ag NON REACTIVE NON REACTIVE  RPR   Collection Time: 05/17/22  4:43 AM  Result Value Ref Range   RPR Ser Ql NON REACTIVE NON REACTIVE  CBC   Collection Time: 05/17/22  4:43 AM  Result Value Ref Range   WBC 8.4 4.0 - 10.5 K/uL   RBC 3.59 (L) 3.87 - 5.11 MIL/uL   Hemoglobin 10.7 (L) 12.0 - 15.0 g/dL   HCT 31.4 (L) 36.0 - 46.0 %   MCV 87.5 80.0 - 100.0 fL   MCH 29.8 26.0 - 34.0 pg   MCHC 34.1 30.0 - 36.0 g/dL   RDW 14.4 11.5 - 15.5 %   Platelets 179 150 - 400 K/uL   nRBC 0.0 0.0 - 0.2 %  Rapid HIV screen (HIV 1/2 Ab+Ag)   Collection Time: 05/17/22  4:43 AM  Result Value Ref Range   HIV-1 P24 Antigen - HIV24 NON REACTIVE NON REACTIVE   HIV 1/2 Antibodies NON REACTIVE NON REACTIVE   Interpretation (HIV Ag Ab)      A non reactive test result means that HIV 1 or HIV 2 antibodies and HIV 1 p24 antigen were not detected in the specimen.  Basic metabolic panel   Collection Time:  05/17/22  4:43 AM  Result Value Ref Range   Sodium 134 (L) 135 - 145 mmol/L   Potassium 2.8 (L) 3.5 - 5.1 mmol/L   Chloride 104 98 - 111 mmol/L   CO2 18 (L) 22 - 32 mmol/L   Glucose, Bld 96 70 - 99 mg/dL   BUN 5 (L) 6 - 20 mg/dL   Creatinine, Ser 0.91 0.44 - 1.00 mg/dL   Calcium 8.8 (L) 8.9 - 10.3 mg/dL   GFR, Estimated >60 >60 mL/min   Anion gap 12 5 - 15  Magnesium   Collection Time: 05/17/22  4:43 AM  Result Value Ref Range   Magnesium 1.5 (L) 1.7 - 2.4 mg/dL  Lactic acid, plasma   Collection Time: 05/17/22  9:26 AM  Result Value Ref Range   Lactic Acid, Venous 1.4 0.5 - 1.9 mmol/L  Results for orders placed or performed during the hospital encounter of 05/16/22 (from the past 168 hour(s))  POCT Urinalysis Dipstick (ED/UC)   Collection Time: 05/16/22  8:35 AM  Result Value Ref Range   Glucose, UA NEGATIVE NEGATIVE mg/dL   Bilirubin Urine NEGATIVE NEGATIVE   Ketones, ur NEGATIVE NEGATIVE mg/dL   Specific Gravity, Urine 1.015 1.005 - 1.030   Hgb urine dipstick MODERATE (A) NEGATIVE   pH 5.5 5.0 - 8.0   Protein, ur 30 (A) NEGATIVE mg/dL   Urobilinogen, UA 0.2 0.0 - 1.0 mg/dL   Nitrite NEGATIVE NEGATIVE   Leukocytes,Ua LARGE (A) NEGATIVE  POC urine preg, ED (not at [facility])   Collection Time: 05/16/22  8:45 AM  Result Value Ref Range   Preg Test, Ur POSITIVE (A) NEGATIVE  POC CBG monitoring   Collection Time: 05/16/22  9:33 AM  Result Value Ref Range   Glucose-Capillary 134 (H) 70 - 99 mg/dL  Urine Culture   Collection Time: 05/16/22  9:35 AM   Specimen: Urine, Clean Catch  Result Value Ref Range   Specimen Description URINE, CLEAN CATCH    Special Requests      NONE Performed at [facility], [address], [place], [place] [address]    Culture >=100,000 COLONIES/mL ESCHERICHIA COLI (A)    Report Status PENDING    No results found.  Future Appointments  Date Time Provider Department Center  06/27/2022  8:55 AM Hermina Staggers, MD [facility]     Discharge Condition: Stable  Discharge disposition: 01-Home or Self Care       Discharge Instructions     Activity as tolerated - No restrictions   Complete by: As directed    Call MD for:   Complete by: As directed    Decreased fetal movement, contractions, and vaginal bleeding.   Call MD for:  difficulty breathing, headache or visual disturbances   Complete by: As directed    Call MD for:  persistant nausea and vomiting   Complete by: As directed    Call MD for:  redness, tenderness, or signs of infection (pain, swelling, redness, odor or green/yellow discharge around incision site)   Complete by: As directed    Call MD for:  severe uncontrolled pain   Complete by: As directed    Call MD for:  temperature >100.4   Complete by: As directed    Diet general   Complete by: As directed    May shower / Bathe   Complete by: As directed       Allergies as of 05/17/2022   No Known Allergies      Medication List     STOP taking these medications    docusate sodium 100 MG capsule Commonly known as: COLACE       TAKE these medications    acetaminophen 325 MG tablet Commonly known as: TYLENOL Take 2 tablets (650 mg total) by mouth every 4 (four) hours as needed for fever (for pain scale < 4  OR  temperature  >/=  100.5 F).   cefadroxil 500 MG capsule Commonly known as: DURICEF Take 1 capsule (500 mg total) by mouth 2 (two) times daily.   cephALEXin 250 MG capsule Commonly known as: Keflex Take 1 capsule (250 mg total) by mouth daily. For suppression - start this after the cefadrozil is completed until delivery   cetirizine 10 MG tablet Commonly known as: ZyrTEC Allergy Take 1 tablet (10 mg total) by mouth at bedtime.   metroNIDAZOLE 500 MG tablet Commonly known as: FLAGYL Take 1 tablet (500 mg total) by mouth every 12 (twelve) hours for 6 days.   potassium chloride SA 20 MEQ tablet Commonly known as: KLOR-CON M Take 2 tablets (40 mEq total) by mouth  2 (two) times daily for 2 days.   prenatal multivitamin Tabs tablet Take 1 tablet by mouth daily at 12 noon.        Follow-up Information     [facility] Follow up.   Specialty: Obstetrics and Gynecology Why: The office will be in touch for your appointment. Contact information: [address] [place] [address] [phone]  Total discharge time: 1 hour   Signed: Radene Gunning M.D. 05/17/2022, 2:34 PM

## 2022-05-18 LAB — URINE CULTURE: Culture: >=100000 — AB

## 2022-05-18 LAB — RUBELLA SCREEN: Rubella: <0.9 index — ABNORMAL LOW (ref >0.99)

## 2022-05-18 LAB — HCV INTERPRETATION

## 2022-05-18 LAB — HCV AB W REFLEX TO QUANT PCR: HCV Ab: NONREACTIVE

## 2022-05-21 LAB — CULTURE, BLOOD (ROUTINE X 2)
Culture: NO GROWTH
Culture: NO GROWTH
Special Requests: ADEQUATE
Special Requests: ADEQUATE

## 2022-06-27 ENCOUNTER — Ambulatory Visit (INDEPENDENT_AMBULATORY_CARE_PROVIDER_SITE_OTHER): Payer: Medicaid Other | Admitting: Obstetrics and Gynecology

## 2022-06-27 ENCOUNTER — Encounter: Payer: Self-pay | Admitting: *Deleted

## 2022-06-27 ENCOUNTER — Encounter: Payer: Self-pay | Admitting: Obstetrics and Gynecology

## 2022-06-27 VITALS — BP 137/91 | HR 103 | Wt 151.8 lb

## 2022-06-27 DIAGNOSIS — O0990 Supervision of high risk pregnancy, unspecified, unspecified trimester: Secondary | ICD-10-CM

## 2022-06-27 DIAGNOSIS — Z3009 Encounter for other general counseling and advice on contraception: Secondary | ICD-10-CM | POA: Insufficient documentation

## 2022-06-27 DIAGNOSIS — O4403 Placenta previa specified as without hemorrhage, third trimester: Secondary | ICD-10-CM

## 2022-06-27 DIAGNOSIS — Z23 Encounter for immunization: Secondary | ICD-10-CM

## 2022-06-27 NOTE — Progress Notes (Signed)
Subjective:  Lacey Ford is a 36 y.o. [id number] at [redacted]w[redacted]d being seen today for her first prenatal visit. EDD by [facility]. Hospitalized 6/19 - 620 for pyelonephritis. Dx with placenta previa at that time as well.  ongoing prenatal care.  She is currently monitored for the following issues for this high-risk pregnancy and has Preterm delivery, delivered; Placenta previa; and Supervision of high risk pregnancy, antepartum on their problem list.  Patient reports general diacomforts of pregnancy. Denies any VB, LOF or ut ctx.  Contractions: Not present. Vag. Bleeding: Scant.  Movement: Present. Denies leaking of fluid.   The following portions of the patient's history were reviewed and updated as appropriate: allergies, current medications, past family history, past medical history, past social history, past surgical history and problem list. Problem list updated.  Objective:   Vitals:   06/27/22 0954  BP: (!) 137/91  Pulse: (!) 103  Weight: 151 lb 12.8 oz (68.9 kg)    Fetal Status: Fetal Heart Rate (bpm): 141   Movement: Present     General:  Alert, oriented and cooperative. Patient is in no acute distress.  Skin: Skin is warm and dry. No rash noted.   Cardiovascular: Normal heart rate noted  Respiratory: Normal respiratory effort, no problems with respiration noted  Abdomen: Soft, gravid, appropriate for gestational age. Pain/Pressure: Present     Pelvic:  Cervical exam deferred        Extremities: Normal range of motion.  Edema: Trace  Mental Status: Normal mood and affect. Normal behavior. Normal judgment and thought content.   Urinalysis:      Assessment and Plan:  Pregnancy: [id number] at [redacted]w[redacted]d  1. Supervision of high risk pregnancy, antepartum Stable Will schedule for Glucola tomorrow - Tdap vaccine greater than or equal to 7yo IM  2. Placenta previa in third trimester Pelvic rest reviewed with pt.  F/U U/S ordered Need for c section discussed Will schedule at next visit   3.  Unwanted fertility     BTL papers signed today Preterm labor symptoms and general obstetric precautions including but not limited to vaginal bleeding, contractions, leaking of fluid and fetal movement were reviewed in detail with the patient. Please refer to After Visit Summary for other counseling recommendations.  Return in about 2 weeks (around 07/11/2022) for OB visit, face to face, MD only.   Hermina Staggers, MD

## 2022-06-27 NOTE — Patient Instructions (Addendum)
AREA PEDIATRIC/FAMILY PRACTICE PHYSICIANS  Central/Southeast [place] ([address]) [facility] Chambliss, MD; Eniola, MD; Hale, MD; Hensel, MD; McDiarmid, MD; McIntyer, MD; Neal, MD; Walden, MD [address] [place], [place] [address] [phone] Mon-Fri 8:30-12:30, 1:30-5:00 Providers come to see babies at [facility] Accepting Medicaid [facility] providers who accept newborns: Koirala, MD; Morrow, MD; Wolters, MD [address], [place], [place] [address] [phone] Mon-Fri 8:00-5:30 Babies seen by providers at [facility] Does NOT accept Medicaid Please call early in hospitalization for appointment (limited availability)  [facility] Mulberry, MD [address] [place], [place] [address] [phone] Mon, Tue, Thur, Fri 8:30-5:00, Wed 10:00-7:00 (closed 1-2pm) Babies seen by [facility] providers Accepting Medicaid Rubin - Pediatrician Rubin, MD [address], [place], [place] [address] [phone] Mon-Fri 8:30-5:00, Sat 8:30-12:00 Provider comes to see babies at [facility] Accepting Medicaid Must have been referred from current patients or contacted office prior to delivery [facility] ([facility]) Brown, MD; Chandler, MD; Ettefagh, MD; Grant, MD; Lester, MD; McCormick, MD; McQueen, MD; Prose, MD; Simha, MD; Stanley, MD; Stryffeler, NP; Tebben, NP [address], [place], [place] [address] [phone] Mon, Tue, Thur, Fri 8:30-5:30, Wed 9:30-5:30, Sat 8:30-12:30 Babies seen by [facility] providers Accepting Medicaid Only accepting infants of first-time parents or siblings of current patients Hospital discharge coordinator will make follow-up appointment Jack Amos [address], [place], [place]  [address] [phone]   Fax - [phone] [facility] [address], [place], [place]  [address] Phone - [phone]   Fax - [phone] Shilpa Gosrani [address], [place], [place]  [address] [phone]  East/Northeast [place] ([address]) [facility] Bates, MD; Brassfield, MD; Cooper, Cox, MD; MD; Davis, MD; Dovico, MD; Ettefaugh, MD; Little, MD; Lowe, MD; Keiffer, MD; Melvin, MD; Sumner, MD; Williams, MD [address], [place], [place] [address] [phone] Mon-Fri 8:30-5:00 (extended evenings Mon-Thur as needed), Sat-Sun 10:00-1:00 Providers come to see babies at [facility] Accepting Medicaid for families of first-time babies and families with all children in the household age 3 and under. Must register with office prior to making appointment (M-F only). [facility] Henson, NP; Knapp, MD; Lalonde, MD; Tysinger, PA [address] [place], [place] [address] [phone] Mon-Fri 8:00-5:00 Babies seen by providers at [facility] Does NOT accept Medicaid/Commercial Insurance Only [facility] ([facility])  Artis, MD; Barnes, MD; Bratton, MD; Coccaro, MD; [place] Gardner, MD; Kramer, MD; Marshall, MD; Netherton, MD; Poleto, MD; Skinner, MD [address] [place], [place] [address] [phone] Mon-Fri 8:30-5:30, Sat (Oct.-Mar.) 9:00-1:00 Babies seen by providers at [facility] Accepting Medicaid  [place] ([address]) [facility] Reid, MD; Warner, MD [address], [place], [place] [address] [phone] Mon-Fri 8:30-5:00, Sat 8:30-12:00 Providers come to see babies at [facility] Does NOT accept Medicaid [facility] Becker, PA; Hagler, MD; Scifres, PA; Sun, MD; Swayne, MD [address], [place], [place] [address] [phone] Mon-Fri 8:00-5:00 Babies seen by providers at [facility] Does NOT accept Medicaid Only accepting babies of parents who  are patients Please call early in hospitalization for appointment (limited availability) [facility] Clark, MD; Frye, MD; Kelleher, MD; Mack, NP; Miller, MD; O'Keller, MD; Patterson, NP; Pudlo, MD; Puzio, MD; Thomas, MD; Tucker, MD; Twiselton, MD [address], [place], [place] [address] [phone] Mon-Fri 8:00-5:00, Sat 9:00-12:00 Providers come to see babies at [facility] Does NOT accept Medicaid  [place] ([address]) [facility] providers accepting new patients: Brake, NP; Wharton, PA [address], [place], [place] [address] [phone] Mon-Fri 8:00-5:00 Babies seen by providers at [facility] Does NOT accept Medicaid Only accepting babies of parents who are patients Please call early in hospitalization for appointment (limited availability) [facility] Gay, MD; Quinlan, MD [address] [place], [place] [address] [phone] (press 1 to schedule appointment) Mon-Fri 8:00-5:00 Providers come to see babies at [facility] Does NOT accept Medicaid [facility] Mazer, MD [address] [place], [place] [address] [phone] Mon-Fri 8:30-5:00 (lunch 12:30-1:00), extended hours by appointment only Wed 5:00-6:30 Babies seen by [facility] providers Accepting Medicaid [facility] Banks, MD; Jordan, MD; Koberlein, MD [address], [place], [place] [address] [phone] Mon-Fri 8:00-5:00 Babies seen by [facility] providers Does NOT accept Medicaid [facility] Parker, MD; Hunter, MD; Wallace, DO [address] [place], [place] [address] [phone] Mon-Fri 8:00-5:00 Babies seen by [facility] providers Does NOT accept Medicaid [facility] Brandon, PA; Brecken, PA; Christy, NP; Dees, MD; DeClaire, MD; DeWeese, MD; Hansen, NP; Mills, NP; Parrish, NP; Smoot, NP; Summer, MD; Vapne,  MD [address] [place], [place] [address] [phone] Mon-Fri 8:30-5:00, Sat 10:00-1:00 Providers come to see babies at [facility] Does NOT accept Medicaid Free prenatal information session Tuesdays at 4:45pm [facility] Bouska, MD; Gordon, PA; Jeffery, PA; Weber, PA [address] [place] [address] [phone] Mon-Fri 7:30-5:30 Babies seen by [facility] providers [facility] [address], [place], [place]  [address] [phone]   Fax - [phone]  [place] ([address] & [address]) [facility] [place] [address] [place], [place] [address] [phone] Mon-Thur 8:00-6:00 Providers come to see babies at [facility] Accepting Medicaid [facility] Anderson, NP; Badger, MD; Beal, PA; Spencer, PA [address] [place], [place] [address] [phone] Mon-Thur 7:30-7:30, Fri 7:30-4:30 Babies seen by [facility] providers Accepting Medicaid [facility] Agbuya, MD; Klett, NP; Romgoolam, MD [address], [place], [place] [address] [phone] Mon-Fri 8:30-5:00, Sat 8:30-12:00 Providers come to see babies at [facility] Accepting Medicaid Must have "Meet & Greet" appointment at office prior to delivery [facility] ([facility]) McCord, MD; Wallace, MD; Wood, MD [address], [place], [place] [address] [phone] Mon-Wed 8:00-6:00, Thur-Fri 8:00-5:00, Sat 9:00-12:00 Providers come to see babies at [facility] Does NOT accept Medicaid Only accepting siblings of current patients [facility]  [address], [place], [place]  [address] [phone]   Fax - [phone] [facility] [address], [place], [place]  [address] [phone]   Fax -  [phone]  [place] ([address] & [address]) [facility] Cirigliano, DO; Matthews, DO [address] [place], [place] [address] [phone] Mon-Fri 7:00-5:00 Babies seen by [facility] providers Does NOT accept Medicaid [facility] Briscoe, MD; Howley, PA; Moreira, PA [address], [place], [place] [address] [phone] Mon-Fri 8:00-5:00 Babies seen by [facility] providers Accepting Medicaid [facility] Boyd, MD; Church, PA; Jones, NP; Osborn, PA [address], [place], [place] [address] [phone] Mon-Fri 8:00-5:00 Babies seen by providers at [facility] Accepting Medicaid  [place] ([address]) [facility] [place] Wendling, DO [address] [place], [place] [address] [phone] Mon-Fri 8:00-5:00 Babies seen by [facility] providers Does NOT accept Medicaid Limited availability, please call early in hospitalization to schedule follow-up [facility] Calderon, PA; Cummings, MD; Dillard, MD; Martin, PA; Olson, MD; VanDeven, PA [address], [place], [place] [address] [phone] Mon-Fri 8:30-5:00, Sat 9:00-12:00 Babies seen by providers at [facility] Accepting Medicaid Please register online then schedule online or call office [email or web address]) Hunter, NP; Kumar, MD; Martin Rogers, PA [address], [place], [place] [address] [phone] Mon-Fri 8:00-5:00 Babies seen by providers at [facility] Accepting Medicaid [facility] ([facility]) Glasgow, MD; Kristi Fleenor, NP; West, MD [address], [place], [place] [address] [phone] Mon-Fri 8:00-5:30, Sat&Sun by appointment (phones open at  8:30) Babies seen by [facility] providers Accepting Medicaid Must be a first-time baby or sibling of current patient [facility] [place]  [address], [place], [place]  [address] [phone]   Fax - [phone]  [place] ([address]) [facility] Brown, PA; Cowen, PA; Rice, MD; Helton, PA; Spry, MD [address] [place], [place] [address] [phone] Mon-Thur 8:00-7:00, Fri 8:00-5:00, Sat 8:00-12:00, Sun 9:00-12:00 Babies seen by [facility] providers Accepting Medicaid [facility] Coe-Goins, MD; Marshall, MD; Pierre-Louis, MD [address], [place], [place] [address] [phone] Mon-Thur 8:00-5:00 Babies seen by providers at [facility] Accepting Medicaid [facility] Bratton, MD; Coe-Goins, MD; Hayes, MD; Lewis, MD; List, MD; Lott, MD; Marshall, MD; Moran, MD; O'Neal, MD; Pierre-Louis, MD; Pitonzo, MD; Scholer, MD; Spangle, MD [address] [place], [place] [address] [phone] Mon-Fri 8:00-5:30, Sat (Oct.-Mar.) 9:00-1:00 Babies seen by providers at [facility] Accepting Medicaid Must fill out new patient packet, available online at [email or web address] [facility] ([facility]) Friddle, NP; Harris, NP; Kelly, NP; Logan, MD; Melvin, PA; Poth, MD; Ramadoss, MD; Stanton, NP [address], [place], [place] [address] [phone] Mon-Thur 8:00-5:30, Fri 8:00-5:00 Babies seen by providers at [facility] Accepting Medicaid  [place] ([address]) [facility] Dixon, PA; Kalaheo, MD; Pickard, MD; Tapia, PA [address], [place], [place] [address] [phone] Mon-Fri 8:00-5:00 Babies seen by providers at [facility] Accepting Medicaid   [place] ([address]) [facility] [place] Masneri, DO; Meyers, MD; Nelson, PA [address], [place], [place] [address] [phone] Mon-Fri 8:00-5:00 Babies seen by providers at [facility] Does NOT accept Medicaid Limited appointment availability, please call early in hospitalization   [facility] [place] Kunedd, DO; McGowen, MD [address], [place], [place] [address] [phone] Mon-Fri 8:00-5:00 Babies seen by [facility] providers Does NOT accept Medicaid [facility] [place] Cameron, MD; MacDonald, MD; Michaels, PA; Nayak, MD [address] [place] [address], [place], [place] [address] [phone] Mon-Fri 8:00-5:00 After hours clinic ([address] [place], [place] [address]) [phone] Mon-Fri 5:00-8:00, Sat 12:00-6:00, Sun 10:00-4:00 Babies seen by [facility] providers Accepting Medicaid [facility] [place] [address] [place], [place]  [address] [phone]   Fax - [phone]  [place] [phone]) Adult [facility], MD [address] [place], [place], [place] [address] [phone] Mon-Fri 8:00-5:00 Babies seen by [facility] providers Does NOT accept Medicaid [facility] [place] [facility] [place]) Rene Kocher, MD [address], [place], [place] [address] [phone] Mon-Thur 8:00-7:00, Fri 8:00-5:00, Sat 8:00-12:00 Babies seen by providers at [facility] Accepting Medicaid - but does not have vaccinations in office (must be received elsewhere) Limited availability, please call early in hospitalization  [place] [phone]) [facility]  Wyvonne Lenz, MD [address] [place] [address] [phone]  Fax [phone]  [facility]  Lyndel Safe, MD, [place], [place], [place], [place] [address] [place], [place]  [address] [phone] [facility]  [address] Sherian [place], [place] [address] [phone] [address], [place], [place] [address] [phone] [facility] Office)  [facility] [address], [place], [place] [address] [phone] [facility] [address] [place], [place], [place] [address] [phone] [facility] [address], [place], [place] [address] [phone] [facility] [address], [place], [place] [address] [phone] [facility] [address], [place], [place] [address] [phone] [facility] [address] [place], [place] [address] [phone] [facility]  [address] [place], [place] [address] [phone] Dr. Belia Heman. [place] [address] [place], [place] [address] [phone] [facility] [address], [place], [place] [address] [phone] [facility] [address] [place], [place] [address] [phone] Placenta Previa  The placenta is the organ formed during pregnancy. It carries oxygen and nutrients to the unborn baby (fetus). The placenta is the baby's life support system. Placenta previa happens when the placenta implants in the lower part of the uterus. The placenta either partially or completely covers the opening to the cervix. This can cause severe bleeding during late pregnancy or delivery. If placenta previa is diagnosed in the first half of pregnancy, the placenta may move into a normal position as the pregnancy progresses. It is important to keep all prenatal visits with your health care provider so that you can be closely monitored. What are the causes? The cause of this condition is not known. What increases the risk? The following factors may make you more likely to develop this condition: Having scars on the lining of the uterus. Having had previous pregnancies. Having had surgeries involving  the uterus, such as a cesarean delivery. Having a history of placenta previa. Having smoked or used cocaine during pregnancy. Being 54 years of age or older during pregnancy. What are the signs or symptoms? The main symptom of this condition is sudden, painless, bright red vaginal bleeding during the second half of pregnancy. The amount of bleeding can be very light at first, and it usually stops on its own. Heavier bleeding episodes may also happen. Some women with placenta previa may have no bleeding at all. How is this diagnosed? This condition may be diagnosed during a routine ultrasound or during a checkup after vaginal bleeding is noticed. In general: If you are diagnosed with placenta previa, digital exams, which use the fingers, will be avoided. Your health care provider will still perform a speculum exam. If you did not have an ultrasound during your pregnancy, placenta previa may not be diagnosed until bleeding occurs during labor. How is this  treated? Treatment for this condition depends on: How much you are bleeding, or whether the bleeding has stopped. How far along you are in your pregnancy. The condition of your baby. How much of the placenta is covering the cervix. Treatment may include: Decreased activity. Bed rest at home or in the hospital. Pelvic rest. Nothing is placed inside the vagina during pelvic rest. This means not having sex, not using tampons, and not using douches. A blood transfusion to replace blood that you have lost (maternal blood loss). A cesarean delivery. This may be performed if: The bleeding is heavy and cannot be controlled. The placenta completely covers the cervix. Medicines to stop premature labor or to help the baby's lungs to mature. This treatment may be used if you need to deliver your baby before your pregnancy is full-term. Follow these instructions at home: Get plenty of rest and reduce activity as told by your health care provider. If  told by your health care provider, stay on bed rest for the length of time that is recommended. Do not have sex, use tampons, douche, or place anything inside of your vagina if your health care provider recommends pelvic rest. Take over-the-counter and prescription medicines only as told by your health care provider. Keep all follow-up visits. This is important. Get help right away if: You have vaginal bleeding, even if in small amounts and even if you have no pain. You have cramping or regular contractions. You have pain in your abdomen or your lower back. You have a feeling of increased pressure in your pelvis. You have increased watery or bloody mucus from your vagina. You have not felt your baby moving regularly. Summary Placenta previa is a condition in which the placenta implants in the lower part of the uterus in a pregnant woman. The cause of this condition is not known. The most common symptom of placenta previa is painless, bright red bleeding during pregnancy. It is important to keep all prenatal visits with your health care provider so you can be closely monitored. Get help right away if you have placenta previa and you are experiencing bleeding during pregnancy. This information is not intended to replace advice given to you by your health care provider. Make sure you discuss any questions you have with your health care provider. Document Revised: 07/06/2020 Document Reviewed: 07/06/2020 Elsevier Patient Education  2023 [employer] Third Trimester of Pregnancy  The third trimester of pregnancy is from week 28 through week 40. This is months 7 through 9. The third trimester is a time when the unborn baby (fetus) is growing rapidly. At the end of the ninth month, the fetus is about 20 inches long and weighs 6-10 pounds. Body changes during your third trimester During the third trimester, your body will continue to go through many changes. The changes vary and generally return to  normal after your baby is born. Physical changes Your weight will continue to increase. You can expect to gain 25-35 pounds (11-16 kg) by the end of the pregnancy if you begin pregnancy at a normal weight. If you are underweight, you can expect to gain 28-40 lb (about 13-18 kg), and if you are overweight, you can expect to gain 15-25 lb (about 7-11 kg). You may begin to get stretch marks on your hips, abdomen, and breasts. Your breasts will continue to grow and may hurt. A yellow fluid (colostrum) may leak from your breasts. This is the first milk you are producing for your baby. You may  have changes in your hair. These can include thickening of your hair, rapid growth, and changes in texture. Some people also have hair loss during or after pregnancy, or hair that feels dry or thin. Your belly button may stick out. You may notice more swelling in your hands, face, or ankles. Health changes You may have heartburn. You may have constipation. You may develop hemorrhoids. You may develop swollen, bulging veins (varicose veins) in your legs. You may have increased body aches in the pelvis, back, or thighs. This is due to weight gain and increased hormones that are relaxing your joints. You may have increased tingling or numbness in your hands, arms, and legs. The skin on your abdomen may also feel numb. You may feel short of breath because of your expanding uterus. Other changes You may urinate more often because the fetus is moving lower into your pelvis and pressing on your bladder. You may have more problems sleeping. This may be caused by the size of your abdomen, an increased need to urinate, and an increase in your body's metabolism. You may notice the fetus "dropping," or moving lower in your abdomen (lightening). You may have increased vaginal discharge. You may notice that you have pain around your pelvic bone as your uterus distends. Follow these instructions at home: Medicines Follow  your health care provider's instructions regarding medicine use. Specific medicines may be either safe or unsafe to take during pregnancy. Do not take any medicines unless approved by your health care provider. Take a prenatal vitamin that contains at least 600 micrograms (mcg) of folic acid. Eating and drinking Eat a healthy diet that includes fresh fruits and vegetables, whole grains, good sources of protein such as meat, eggs, or tofu, and low-fat dairy products. Avoid raw meat and unpasteurized juice, milk, and cheese. These carry germs that can harm you and your baby. Eat 4 or 5 small meals rather than 3 large meals a day. You may need to take these actions to prevent or treat constipation: Drink enough fluid to keep your urine pale yellow. Eat foods that are high in fiber, such as beans, whole grains, and fresh fruits and vegetables. Limit foods that are high in fat and processed sugars, such as fried or sweet foods. Activity Exercise only as directed by your health care provider. Most people can continue their usual exercise routine during pregnancy. Try to exercise for 30 minutes at least 5 days a week. Stop exercising if you experience contractions in the uterus. Stop exercising if you develop pain or cramping in the lower abdomen or lower back. Avoid heavy lifting. Do not exercise if it is very hot or humid or if you are at a high altitude. If you choose to, you may continue to have sex unless your health care provider tells you not to. Relieving pain and discomfort Take frequent breaks and rest with your legs raised (elevated) if you have leg cramps or low back pain. Take warm sitz baths to soothe any pain or discomfort caused by hemorrhoids. Use hemorrhoid cream if your health care provider approves. Wear a supportive bra to prevent discomfort from breast tenderness. If you develop varicose veins: Wear support hose as told by your health care provider. Elevate your feet for 15  minutes, 3-4 times a day. Limit salt in your diet. Safety Talk to your health care provider before traveling far distances. Do not use hot tubs, steam rooms, or saunas. Wear your seat belt at all times when driving or  riding in a car. Talk with your health care provider if someone is verbally or physically abusive to you. Preparing for birth To prepare for the arrival of your baby: Take prenatal classes to understand, practice, and ask questions about labor and delivery. Visit the hospital and tour the maternity area. Purchase a rear-facing car seat and make sure you know how to install it in your car. Prepare the baby's room or sleeping area. Make sure to remove all pillows and stuffed animals from the baby's crib to prevent suffocation. General instructions Avoid cat litter boxes and soil used by cats. These carry germs that can cause birth defects in the baby. If you have a cat, ask someone to clean the litter box for you. Do not douche or use tampons. Do not use scented sanitary pads. Do not use any products that contain nicotine or tobacco, such as cigarettes, e-cigarettes, and chewing tobacco. If you need help quitting, ask your health care provider. Do not use any herbal remedies, illegal drugs, or medicines that were not prescribed to you. Chemicals in these products can harm your baby. Do not drink alcohol. You will have more frequent prenatal exams during the third trimester. During a routine prenatal visit, your health care provider will do a physical exam, perform tests, and discuss your overall health. Keep all follow-up visits. This is important. Where to find more information American Pregnancy Association: americanpregnancy.org [employer]: https://www.todd-brady.net/ Office on [employer] Health: [employer].nz Contact a health care provider if you have: A fever. Mild pelvic cramps, pelvic pressure, or nagging pain  in your abdominal area or lower back. Vomiting or diarrhea. Bad-smelling vaginal discharge or foul-smelling urine. Pain when you urinate. A headache that does not go away when you take medicine. Visual changes or see spots in front of your eyes. Get help right away if: Your water breaks. You have regular contractions less than 5 minutes apart. You have spotting or bleeding from your vagina. You have severe abdominal pain. You have difficulty breathing. You have chest pain. You have fainting spells. You have not felt your baby move for the time period told by your health care provider. You have new or increased pain, swelling, or redness in an arm or leg. Summary The third trimester of pregnancy is from week 28 through week 40 (months 7 through 9). You may have more problems sleeping. This can be caused by the size of your abdomen, an increased need to urinate, and an increase in your body's metabolism. You will have more frequent prenatal exams during the third trimester. Keep all follow-up visits. This is important. This information is not intended to replace advice given to you by your health care provider. Make sure you discuss any questions you have with your health care provider. Document Revised: 04/22/2020 Document Reviewed: 02/27/2020 Elsevier Patient Education  2023 [employer]. Third Trimester of Pregnancy  The third trimester of pregnancy is from week 28 through week 40. This is months 7 through 9. The third trimester is a time when the unborn baby (fetus) is growing rapidly. At the end of the ninth month, the fetus is about 20 inches long and weighs 6-10 pounds. Body changes during your third trimester During the third trimester, your body will continue to go through many changes. The changes vary and generally return to normal after your baby is born. Physical changes Your weight will continue to increase. You can expect to gain 25-35 pounds (11-16 kg) by the end of  the  pregnancy if you begin pregnancy at a normal weight. If you are underweight, you can expect to gain 28-40 lb (about 13-18 kg), and if you are overweight, you can expect to gain 15-25 lb (about 7-11 kg). You may begin to get stretch marks on your hips, abdomen, and breasts. Your breasts will continue to grow and may hurt. A yellow fluid (colostrum) may leak from your breasts. This is the first milk you are producing for your baby. You may have changes in your hair. These can include thickening of your hair, rapid growth, and changes in texture. Some people also have hair loss during or after pregnancy, or hair that feels dry or thin. Your belly button may stick out. You may notice more swelling in your hands, face, or ankles. Health changes You may have heartburn. You may have constipation. You may develop hemorrhoids. You may develop swollen, bulging veins (varicose veins) in your legs. You may have increased body aches in the pelvis, back, or thighs. This is due to weight gain and increased hormones that are relaxing your joints. You may have increased tingling or numbness in your hands, arms, and legs. The skin on your abdomen may also feel numb. You may feel short of breath because of your expanding uterus. Other changes You may urinate more often because the fetus is moving lower into your pelvis and pressing on your bladder. You may have more problems sleeping. This may be caused by the size of your abdomen, an increased need to urinate, and an increase in your body's metabolism. You may notice the fetus "dropping," or moving lower in your abdomen (lightening). You may have increased vaginal discharge. You may notice that you have pain around your pelvic bone as your uterus distends. Follow these instructions at home: Medicines Follow your health care provider's instructions regarding medicine use. Specific medicines may be either safe or unsafe to take during pregnancy. Do not take any  medicines unless approved by your health care provider. Take a prenatal vitamin that contains at least 600 micrograms (mcg) of folic acid. Eating and drinking Eat a healthy diet that includes fresh fruits and vegetables, whole grains, good sources of protein such as meat, eggs, or tofu, and low-fat dairy products. Avoid raw meat and unpasteurized juice, milk, and cheese. These carry germs that can harm you and your baby. Eat 4 or 5 small meals rather than 3 large meals a day. You may need to take these actions to prevent or treat constipation: Drink enough fluid to keep your urine pale yellow. Eat foods that are high in fiber, such as beans, whole grains, and fresh fruits and vegetables. Limit foods that are high in fat and processed sugars, such as fried or sweet foods. Activity Exercise only as directed by your health care provider. Most people can continue their usual exercise routine during pregnancy. Try to exercise for 30 minutes at least 5 days a week. Stop exercising if you experience contractions in the uterus. Stop exercising if you develop pain or cramping in the lower abdomen or lower back. Avoid heavy lifting. Do not exercise if it is very hot or humid or if you are at a high altitude. If you choose to, you may continue to have sex unless your health care provider tells you not to. Relieving pain and discomfort Take frequent breaks and rest with your legs raised (elevated) if you have leg cramps or low back pain. Take warm sitz baths to soothe any pain or  discomfort caused by hemorrhoids. Use hemorrhoid cream if your health care provider approves. Wear a supportive bra to prevent discomfort from breast tenderness. If you develop varicose veins: Wear support hose as told by your health care provider. Elevate your feet for 15 minutes, 3-4 times a day. Limit salt in your diet. Safety Talk to your health care provider before traveling far distances. Do not use hot tubs, steam  rooms, or saunas. Wear your seat belt at all times when driving or riding in a car. Talk with your health care provider if someone is verbally or physically abusive to you. Preparing for birth To prepare for the arrival of your baby: Take prenatal classes to understand, practice, and ask questions about labor and delivery. Visit the hospital and tour the maternity area. Purchase a rear-facing car seat and make sure you know how to install it in your car. Prepare the baby's room or sleeping area. Make sure to remove all pillows and stuffed animals from the baby's crib to prevent suffocation. General instructions Avoid cat litter boxes and soil used by cats. These carry germs that can cause birth defects in the baby. If you have a cat, ask someone to clean the litter box for you. Do not douche or use tampons. Do not use scented sanitary pads. Do not use any products that contain nicotine or tobacco, such as cigarettes, e-cigarettes, and chewing tobacco. If you need help quitting, ask your health care provider. Do not use any herbal remedies, illegal drugs, or medicines that were not prescribed to you. Chemicals in these products can harm your baby. Do not drink alcohol. You will have more frequent prenatal exams during the third trimester. During a routine prenatal visit, your health care provider will do a physical exam, perform tests, and discuss your overall health. Keep all follow-up visits. This is important. Where to find more information American Pregnancy Association: americanpregnancy.org [employer]ologists: https://www.todd-brady.net/ Office on [employer] Health: [employer]nz Contact a health care provider if you have: A fever. Mild pelvic cramps, pelvic pressure, or nagging pain in your abdominal area or lower back. Vomiting or diarrhea. Bad-smelling vaginal discharge or foul-smelling urine. Pain when you urinate. A headache  that does not go away when you take medicine. Visual changes or see spots in front of your eyes. Get help right away if: Your water breaks. You have regular contractions less than 5 minutes apart. You have spotting or bleeding from your vagina. You have severe abdominal pain. You have difficulty breathing. You have chest pain. You have fainting spells. You have not felt your baby move for the time period told by your health care provider. You have new or increased pain, swelling, or redness in an arm or leg. Summary The third trimester of pregnancy is from week 28 through week 40 (months 7 through 9). You may have more problems sleeping. This can be caused by the size of your abdomen, an increased need to urinate, and an increase in your body's metabolism. You will have more frequent prenatal exams during the third trimester. Keep all follow-up visits. This is important. This information is not intended to replace advice given to you by your health care provider. Make sure you discuss any questions you have with your health care provider. Document Revised: 04/22/2020 Document Reviewed: 02/27/2020 Elsevier Patient Education  2023 [employer].

## 2022-06-27 NOTE — Progress Notes (Signed)
BTL signed today

## 2022-06-28 ENCOUNTER — Other Ambulatory Visit: Payer: Self-pay

## 2022-06-28 DIAGNOSIS — O0990 Supervision of high risk pregnancy, unspecified, unspecified trimester: Secondary | ICD-10-CM

## 2022-06-30 ENCOUNTER — Other Ambulatory Visit: Payer: Self-pay

## 2022-07-05 ENCOUNTER — Other Ambulatory Visit: Payer: Self-pay

## 2022-07-13 ENCOUNTER — Encounter: Payer: Self-pay | Admitting: Family Medicine

## 2022-07-27 ENCOUNTER — Encounter: Payer: Self-pay | Admitting: Family Medicine

## 2022-07-28 ENCOUNTER — Ambulatory Visit (HOSPITAL_COMMUNITY)
Admission: EM | Admit: 2022-07-28 | Discharge: 2022-07-28 | Disposition: A | Discharge status: Home / Self Care | Payer: Self-pay | Attending: Emergency Medicine | Admitting: Emergency Medicine

## 2022-07-28 ENCOUNTER — Encounter (HOSPITAL_COMMUNITY): Payer: Self-pay | Admitting: Emergency Medicine

## 2022-07-28 DIAGNOSIS — T1592XA Foreign body on external eye, part unspecified, left eye, initial encounter: Secondary | ICD-10-CM

## 2022-07-28 MED ORDER — EYE WASH OP SOLN
OPHTHALMIC | Status: AC
  Filled 2022-07-28: qty 118

## 2022-07-28 MED ORDER — TETRACAINE HCL 0.5 % OP SOLN
OPHTHALMIC | Status: AC
  Filled 2022-07-28: qty 4

## 2022-07-28 MED ORDER — MOXIFLOXACIN HCL 0.5 % OP SOLN: 1.0000 [drp] | Freq: Three times a day (TID) | OPHTHALMIC | 0 refills | Status: AC

## 2022-07-28 NOTE — ED Triage Notes (Signed)
Patient states that she something in her left eye since last night.  Her eye is red, watery eyes.  No glasses or contacts.  Denies any OTC meds.

## 2022-07-28 NOTE — Discharge Instructions (Signed)
Use the eye drops two or three times a day for the next 5 days.  You can use the eye rinse as needed.  Hold warm compress to the eyelid to help with irritation.  Follow up with eye specialists if symptoms persist.

## 2022-07-28 NOTE — ED Provider Notes (Signed)
[facility]    CSN: [id number] Arrival date & time: 07/28/22  1615      History   Chief Complaint Chief Complaint  Patient presents with   Foreign Body in Eye    HPI Lacey Ford is a 36 y.o. female.  Presents with foreign body sensation in the left eye. Began last night. Thought she saw something in there when looking in mirror. Feels better when she lifts her eyelids off her eye. Lots of irritation, has been rubbing at it  Does not wear contacts  Currently 8 months pregnant.  Past Medical History:  Diagnosis Date   Preeclampsia 05/30/2019    Patient Active Problem List   Diagnosis Date Noted   Supervision of high risk pregnancy, antepartum 06/27/2022   Unwanted fertility 06/27/2022   Placenta previa 05/16/2022   Preterm delivery, delivered 05/30/2019    History reviewed. No pertinent surgical history.  OB History     Gravida  7   Para  3   Term  2   Preterm  1   AB  3   Living  3      SAB  2   IAB  1   Ectopic      Multiple      Live Births  3            Home Medications    Prior to Admission medications   Medication Sig Start Date End Date Taking? Authorizing Provider  moxifloxacin (VIGAMOX) 0.5 % ophthalmic solution Place 1 drop into the left eye 3 (three) times daily for 5 days. 07/28/22 08/02/22 Yes Rankin Coolman, Lurena Joiner, PA-C  Prenatal Vit-Fe Fumarate-FA (PRENATAL MULTIVITAMIN) TABS tablet Take 1 tablet by mouth daily at 12 noon. 05/17/22  Yes Milas Hock, MD  acetaminophen (TYLENOL) 325 MG tablet Take 2 tablets (650 mg total) by mouth every 4 (four) hours as needed for fever (for pain scale < 4  OR  temperature  >/=  100.5 F). 05/17/22   Milas Hock, MD  cefadroxil (DURICEF) 500 MG capsule Take 1 capsule (500 mg total) by mouth 2 (two) times daily. 05/17/22   Milas Hock, MD  cephALEXin (KEFLEX) 250 MG capsule Take 1 capsule (250 mg total) by mouth daily. For suppression - start this after the cefadrozil is completed until  delivery Patient not taking: Reported on 06/27/2022 05/17/22   Milas Hock, MD  cetirizine (ZYRTEC ALLERGY) 10 MG tablet Take 1 tablet (10 mg total) by mouth at bedtime. 07/14/21   Rushie Chestnut, PA-C  potassium chloride SA (KLOR-CON M) 20 MEQ tablet Take 2 tablets (40 mEq total) by mouth 2 (two) times daily for 2 days. 05/17/22 05/19/22  Milas Hock, MD    Family History Family History  Problem Relation Age of Onset   Epilepsy Mother     Social History Social History   Tobacco Use   Smoking status: Every Day    Packs/day: 1.00    Types: Cigarettes   Smokeless tobacco: Never  Substance Use Topics   Alcohol use: Yes    Comment: Occasional   Drug use: No     Allergies   Patient has no known allergies.   Review of Systems Review of Systems Per HPI  Physical Exam Triage Vital Signs ED Triage Vitals  Enc Vitals Group     BP      Pulse      Resp      Temp      Temp src  SpO2      Weight      Height      Head Circumference      Peak Flow      Pain Score      Pain Loc      Pain Edu?      Excl. in GC?    No data found.  Updated Vital Signs BP (!) 155/92 (BP Location: Right Arm)   Pulse 99   Temp (!) 97.4 F (36.3 C) (Oral)   Resp 18   Ht 5\' 5"  (1.651 m)   Wt 155 lb (70.3 kg)   LMP  (LMP Unknown) Comment: per ultrasound scan done 6/19  SpO2 98%   BMI 25.79 kg/m   Visual Acuity Right Eye Distance: unable to obtain Left Eye Distance: unable to obtain Bilateral Distance: unable to obtain  Right Eye Near: R Near: unable to obtain Left Eye Near:  L Near: unable to obtain Bilateral Near:   (unable to obtain)  Visual acuity could not be obtained due to patient discomfort with keeping eye open  Physical Exam Vitals and nursing note reviewed.  HENT:     Mouth/Throat:     Pharynx: Oropharynx is clear.  Eyes:     General:        Left eye: Foreign body present.No hordeolum.     Extraocular Movements: Extraocular movements intact.      Conjunctiva/sclera:     Left eye: Left conjunctiva is injected.     Pupils: Pupils are equal, round, and reactive to light.     Comments: Irritated upper eyelid  Cardiovascular:     Rate and Rhythm: Normal rate and regular rhythm.     Pulses: Normal pulses.     Heart sounds: Normal heart sounds.  Pulmonary:     Effort: Pulmonary effort is normal.     Breath sounds: Normal breath sounds.  Neurological:     Mental Status: She is alert and oriented to person, place, and time.     UC Treatments / Results  Labs (all labs ordered are listed, but only abnormal results are displayed) Labs Reviewed - No data to display  EKG  Radiology No results found.  Procedures Foreign Body Removal  Date/Time: 07/28/2022 5:32 PM  Performed by: 07/30/2022, PA-C Authorized by: Marlow Baars, PA-C   Consent:    Consent obtained:  Verbal   Consent given by:  Patient Universal protocol:    Patient identity confirmed:  Verbally with patient Location:    Location:  Face   Face location: left eye. Anesthesia:    Anesthesia method:  Topical application   Topical anesthesia: tetracaine drops. Procedure type:    Procedure complexity:  Simple Procedure details:    Localization method:  Visualized (visualized on woods lamp exam)   Removal mechanism:  Irrigation (and swab)   Foreign bodies recovered:  1 Post-procedure details:    Confirmation:  No additional foreign bodies on visualization   Procedure completion:  Tolerated   Medications Ordered in UC Medications - No data to display  Initial Impression / Assessment and Plan / UC Course  I have reviewed the triage vital signs and the nursing notes.  Pertinent labs & imaging results that were available during my care of the patient were reviewed by me and considered in my medical decision making (see chart for details).  No ulcer or abrasion seen on fluorescein stain and woods lamp. 2 cm length hair noted on lateral eyeball. Removed  with swab  and copious irrigation. Abx drops 2-3 times daily for the next 5 days, follow up with eye specialist as needed. Return precautions discussed. Patient agrees to plan  Final Clinical Impressions(s) / UC Diagnoses   Final diagnoses:  Foreign body of left eye, initial encounter     Discharge Instructions      Use the eye drops two or three times a day for the next 5 days.  You can use the eye rinse as needed.  Hold warm compress to the eyelid to help with irritation.  Follow up with eye specialists if symptoms persist.     ED Prescriptions     Medication Sig Dispense Auth. Provider   moxifloxacin (VIGAMOX) 0.5 % ophthalmic solution Place 1 drop into the left eye 3 (three) times daily for 5 days. 3 mL Frankey Botting, Lurena Joiner, PA-C      PDMP not reviewed this encounter.   Jerime Arif, Ray Church 07/28/22 1755

## 2022-08-03 ENCOUNTER — Encounter: Payer: Self-pay | Admitting: Family Medicine

## 2022-08-08 NOTE — Progress Notes (Signed)
Pt inquired at Check in desk in [facility]U about her having a  placenta previa and was told she needed to have a csection at 37 weeks. Pt has not had prenatal care except for 1 visit in July.  Looked up pt chart and saw appointment scheduled with Dr. Crissie Reese on Wed 08/10/22 Pt stated she was unaware of appoinment. Pt denies any problems tonight just came in to ask about when she was going to have a c-section. Gave pt number to [facility] and instructed her to call them in the morning to confirm appointment. Told her to came to [facility] if she felt like she was having any problems or if she thought she was in labor. Pt agreeable to plan.

## 2022-08-10 ENCOUNTER — Encounter: Payer: Self-pay | Admitting: Family Medicine

## 2022-08-10 ENCOUNTER — Inpatient Hospital Stay (HOSPITAL_COMMUNITY)
Admission: AD | Admit: 2022-08-10 | Discharge: 2022-08-13 | DRG: 787 | Disposition: A | Discharge status: Home / Self Care | Payer: Self-pay | Attending: Obstetrics and Gynecology | Admitting: Obstetrics and Gynecology

## 2022-08-10 ENCOUNTER — Inpatient Hospital Stay (HOSPITAL_COMMUNITY): Payer: Self-pay | Admitting: Anesthesiology

## 2022-08-10 ENCOUNTER — Other Ambulatory Visit: Payer: Self-pay

## 2022-08-10 ENCOUNTER — Encounter (HOSPITAL_COMMUNITY): Payer: Self-pay | Admitting: Obstetrics & Gynecology

## 2022-08-10 ENCOUNTER — Inpatient Hospital Stay (HOSPITAL_BASED_OUTPATIENT_CLINIC_OR_DEPARTMENT_OTHER): Payer: Self-pay

## 2022-08-10 ENCOUNTER — Ambulatory Visit (INDEPENDENT_AMBULATORY_CARE_PROVIDER_SITE_OTHER): Payer: Self-pay | Admitting: Family Medicine

## 2022-08-10 ENCOUNTER — Encounter (HOSPITAL_COMMUNITY)
Admission: AD | Disposition: A | Discharge status: Home / Self Care | Payer: Self-pay | Source: Home / Self Care | Attending: Obstetrics and Gynecology

## 2022-08-10 VITALS — BP 150/90 | HR 111

## 2022-08-10 DIAGNOSIS — Z3A38 38 weeks gestation of pregnancy: Secondary | ICD-10-CM

## 2022-08-10 DIAGNOSIS — O99324 Drug use complicating childbirth: Secondary | ICD-10-CM | POA: Diagnosis present

## 2022-08-10 DIAGNOSIS — O134 Gestational [pregnancy-induced] hypertension without significant proteinuria, complicating childbirth: Secondary | ICD-10-CM

## 2022-08-10 DIAGNOSIS — O4443 Low lying placenta NOS or without hemorrhage, third trimester: Secondary | ICD-10-CM

## 2022-08-10 DIAGNOSIS — Z98891 History of uterine scar from previous surgery: Secondary | ICD-10-CM

## 2022-08-10 DIAGNOSIS — Z3009 Encounter for other general counseling and advice on contraception: Secondary | ICD-10-CM

## 2022-08-10 DIAGNOSIS — O9902 Anemia complicating childbirth: Secondary | ICD-10-CM | POA: Diagnosis present

## 2022-08-10 DIAGNOSIS — O4403 Placenta previa specified as without hemorrhage, third trimester: Secondary | ICD-10-CM

## 2022-08-10 DIAGNOSIS — F17210 Nicotine dependence, cigarettes, uncomplicated: Secondary | ICD-10-CM | POA: Diagnosis present

## 2022-08-10 DIAGNOSIS — Z8759 Personal history of other complications of pregnancy, childbirth and the puerperium: Secondary | ICD-10-CM | POA: Diagnosis present

## 2022-08-10 DIAGNOSIS — Z23 Encounter for immunization: Secondary | ICD-10-CM

## 2022-08-10 DIAGNOSIS — O0990 Supervision of high risk pregnancy, unspecified, unspecified trimester: Secondary | ICD-10-CM

## 2022-08-10 DIAGNOSIS — F1290 Cannabis use, unspecified, uncomplicated: Secondary | ICD-10-CM | POA: Diagnosis present

## 2022-08-10 DIAGNOSIS — O99320 Drug use complicating pregnancy, unspecified trimester: Secondary | ICD-10-CM

## 2022-08-10 DIAGNOSIS — O0930 Supervision of pregnancy with insufficient antenatal care, unspecified trimester: Secondary | ICD-10-CM

## 2022-08-10 DIAGNOSIS — O99334 Smoking (tobacco) complicating childbirth: Secondary | ICD-10-CM | POA: Diagnosis present

## 2022-08-10 DIAGNOSIS — O133 Gestational [pregnancy-induced] hypertension without significant proteinuria, third trimester: Secondary | ICD-10-CM

## 2022-08-10 DIAGNOSIS — O139 Gestational [pregnancy-induced] hypertension without significant proteinuria, unspecified trimester: Secondary | ICD-10-CM

## 2022-08-10 LAB — COMPREHENSIVE METABOLIC PANEL
ALT: 16 U/L (ref 0–44)
AST: 15 U/L (ref 15–41)
Albumin: 2.4 g/dL — ABNORMAL LOW (ref 3.5–5.0)
Alkaline Phosphatase: 193 U/L — ABNORMAL HIGH (ref 38–126)
Anion gap: 9 (ref 5–15)
BUN: 7 mg/dL (ref 6–20)
CO2: 20 mmol/L — ABNORMAL LOW (ref 22–32)
Calcium: 8.5 mg/dL — ABNORMAL LOW (ref 8.9–10.3)
Chloride: 106 mmol/L (ref 98–111)
Creatinine, Ser: 0.56 mg/dL (ref 0.44–1.00)
GFR, Estimated: >60 mL/min (ref >60)
Glucose, Bld: 93 mg/dL (ref 70–99)
Potassium: 3.9 mmol/L (ref 3.5–5.1)
Sodium: 135 mmol/L (ref 135–145)
Total Bilirubin: 0.4 mg/dL (ref 0.3–1.2)
Total Protein: 6.8 g/dL (ref 6.5–8.1)

## 2022-08-10 LAB — CBC WITH DIFFERENTIAL/PLATELET
Abs Immature Granulocytes: 0.03 10*3/uL (ref 0.00–0.07)
Basophils Absolute: 0.1 10*3/uL (ref 0.0–0.1)
Basophils Relative: 1 %
Eosinophils Absolute: 0.1 10*3/uL (ref 0.0–0.5)
Eosinophils Relative: 1 %
HCT: 29.9 % — ABNORMAL LOW (ref 36.0–46.0)
Hemoglobin: 9.7 g/dL — ABNORMAL LOW (ref 12.0–15.0)
Immature Granulocytes: 0 %
Lymphocytes Relative: 18 %
Lymphs Abs: 1.6 10*3/uL (ref 0.7–4.0)
MCH: 24.9 pg — ABNORMAL LOW (ref 26.0–34.0)
MCHC: 32.4 g/dL (ref 30.0–36.0)
MCV: 76.9 fL — ABNORMAL LOW (ref 80.0–100.0)
Monocytes Absolute: 0.8 10*3/uL (ref 0.1–1.0)
Monocytes Relative: 9 %
Neutro Abs: 6.6 10*3/uL (ref 1.7–7.7)
Neutrophils Relative %: 71 %
Platelets: 214 10*3/uL (ref 150–400)
RBC: 3.89 MIL/uL (ref 3.87–5.11)
RDW: 15.1 % (ref 11.5–15.5)
WBC: 9.2 10*3/uL (ref 4.0–10.5)
nRBC: 0 % (ref 0.0–0.2)

## 2022-08-10 LAB — PROTEIN / CREATININE RATIO, URINE
Creatinine, Urine: 96 mg/dL
Protein Creatinine Ratio: 0.2 mg/mg{Cre} — ABNORMAL HIGH (ref 0.00–0.15)
Total Protein, Urine: 19 mg/dL

## 2022-08-10 LAB — URINALYSIS, ROUTINE W REFLEX MICROSCOPIC
Bilirubin Urine: NEGATIVE
Glucose, UA: NEGATIVE mg/dL
Ketones, ur: 5 mg/dL — AB
Nitrite: POSITIVE — AB
Protein, ur: NEGATIVE mg/dL
Specific Gravity, Urine: 1.018 (ref 1.005–1.030)
WBC, UA: >50 WBC/hpf — ABNORMAL HIGH (ref 0–5)
pH: 6 (ref 5.0–8.0)

## 2022-08-10 LAB — PREPARE RBC (CROSSMATCH)

## 2022-08-10 LAB — RAPID URINE DRUG SCREEN, HOSP PERFORMED
Amphetamines: POSITIVE — AB
Barbiturates: NOT DETECTED
Benzodiazepines: NOT DETECTED
Cocaine: NOT DETECTED
Opiates: NOT DETECTED
Tetrahydrocannabinol: POSITIVE — AB

## 2022-08-10 SURGERY — Surgical Case
Anesthesia: Spinal

## 2022-08-10 MED ORDER — SCOPOLAMINE 1 MG/3DAYS TD PT72
MEDICATED_PATCH | TRANSDERMAL | Status: AC
  Filled 2022-08-10: qty 1

## 2022-08-10 MED ORDER — WITCH HAZEL-GLYCERIN EX PADS: 1.0000 | MEDICATED_PAD | CUTANEOUS | Status: DC | PRN

## 2022-08-10 MED ORDER — ONDANSETRON HCL 4 MG/2ML IJ SOLN
INTRAMUSCULAR | Status: AC
  Filled 2022-08-10: qty 2

## 2022-08-10 MED ORDER — CEFAZOLIN SODIUM-DEXTROSE 2-4 GM/100ML-% IV SOLN
2.0000 g | INTRAVENOUS | Status: AC
  Administered 2022-08-10: 2 g via INTRAVENOUS
  Filled 2022-08-10: qty 100

## 2022-08-10 MED ORDER — MEASLES, MUMPS & RUBELLA VAC IJ SOLR
0.5000 mL | Freq: Once | INTRAMUSCULAR | Status: AC
  Administered 2022-08-11: 0.5 mL via SUBCUTANEOUS
  Filled 2022-08-10: qty 0.5

## 2022-08-10 MED ORDER — LACTATED RINGERS IV SOLN: INTRAVENOUS | Status: DC

## 2022-08-10 MED ORDER — DEXAMETHASONE SODIUM PHOSPHATE 4 MG/ML IJ SOLN
INTRAMUSCULAR | Status: AC
  Filled 2022-08-10: qty 1

## 2022-08-10 MED ORDER — DIPHENHYDRAMINE HCL 25 MG PO CAPS
25.0000 mg | ORAL_CAPSULE | ORAL | Status: DC | PRN
  Filled 2022-08-10: qty 1

## 2022-08-10 MED ORDER — NALOXONE HCL 0.4 MG/ML IJ SOLN: 0.4000 mg | INTRAMUSCULAR | Status: DC | PRN

## 2022-08-10 MED ORDER — PHENYLEPHRINE HCL-NACL 20-0.9 MG/250ML-% IV SOLN
INTRAVENOUS | Status: DC | PRN
  Administered 2022-08-10: 60 ug/min via INTRAVENOUS

## 2022-08-10 MED ORDER — DIBUCAINE (PERIANAL) 1 % EX OINT: 1.0000 | TOPICAL_OINTMENT | CUTANEOUS | Status: DC | PRN

## 2022-08-10 MED ORDER — MORPHINE SULFATE (PF) 0.5 MG/ML IJ SOLN
INTRAMUSCULAR | Status: DC | PRN
  Administered 2022-08-10: 150 ug via INTRATHECAL

## 2022-08-10 MED ORDER — ONDANSETRON HCL 4 MG/2ML IJ SOLN
4.0000 mg | Freq: Three times a day (TID) | INTRAMUSCULAR | Status: DC | PRN
  Administered 2022-08-10: 4 mg via INTRAVENOUS

## 2022-08-10 MED ORDER — SENNOSIDES-DOCUSATE SODIUM 8.6-50 MG PO TABS
2.0000 | ORAL_TABLET | Freq: Every day | ORAL | Status: DC
  Administered 2022-08-11 – 2022-08-13 (×3): 2 via ORAL
  Filled 2022-08-10 (×3): qty 2

## 2022-08-10 MED ORDER — GABAPENTIN 100 MG PO CAPS
200.0000 mg | ORAL_CAPSULE | Freq: Every day | ORAL | Status: DC
  Administered 2022-08-11 – 2022-08-12 (×3): 200 mg via ORAL
  Filled 2022-08-10 (×3): qty 2

## 2022-08-10 MED ORDER — DIPHENHYDRAMINE HCL 50 MG/ML IJ SOLN
INTRAMUSCULAR | Status: AC
  Filled 2022-08-10: qty 1

## 2022-08-10 MED ORDER — SCOPOLAMINE 1 MG/3DAYS TD PT72
1.0000 | MEDICATED_PATCH | Freq: Once | TRANSDERMAL | Status: DC
  Administered 2022-08-10: 1.5 mg via TRANSDERMAL

## 2022-08-10 MED ORDER — MENTHOL 3 MG MT LOZG: 1.0000 | LOZENGE | OROMUCOSAL | Status: DC | PRN

## 2022-08-10 MED ORDER — OXYCODONE HCL 5 MG PO TABS
5.0000 mg | ORAL_TABLET | ORAL | Status: DC | PRN
  Administered 2022-08-12: 10 mg via ORAL
  Filled 2022-08-10: qty 2

## 2022-08-10 MED ORDER — OXYCODONE HCL 5 MG PO TABS: 5.0000 mg | ORAL_TABLET | Freq: Once | ORAL | Status: DC | PRN

## 2022-08-10 MED ORDER — DEXMEDETOMIDINE HCL IN NACL 80 MCG/20ML IV SOLN
INTRAVENOUS | Status: AC
  Filled 2022-08-10: qty 20

## 2022-08-10 MED ORDER — FENTANYL CITRATE (PF) 100 MCG/2ML IJ SOLN
INTRAMUSCULAR | Status: DC | PRN
  Administered 2022-08-10: 15 ug via INTRATHECAL

## 2022-08-10 MED ORDER — SOD CITRATE-CITRIC ACID 500-334 MG/5ML PO SOLN
30.0000 mL | ORAL | Status: AC
  Administered 2022-08-10: 30 mL via ORAL
  Filled 2022-08-10: qty 30

## 2022-08-10 MED ORDER — SODIUM CHLORIDE 0.9% IV SOLUTION: Freq: Once | INTRAVENOUS | Status: DC

## 2022-08-10 MED ORDER — KETOROLAC TROMETHAMINE 30 MG/ML IJ SOLN
30.0000 mg | Freq: Once | INTRAMUSCULAR | Status: AC | PRN
  Administered 2022-08-10: 30 mg via INTRAVENOUS

## 2022-08-10 MED ORDER — DEXMEDETOMIDINE HCL IN NACL 80 MCG/20ML IV SOLN
INTRAVENOUS | Status: DC | PRN
  Administered 2022-08-10 (×3): 4 ug via BUCCAL

## 2022-08-10 MED ORDER — COCONUT OIL OIL: 1.0000 | TOPICAL_OIL | Status: DC | PRN

## 2022-08-10 MED ORDER — SIMETHICONE 80 MG PO CHEW: 80.0000 mg | CHEWABLE_TABLET | ORAL | Status: DC | PRN

## 2022-08-10 MED ORDER — ONDANSETRON HCL 4 MG/2ML IJ SOLN: 4.0000 mg | Freq: Once | INTRAMUSCULAR | Status: DC | PRN

## 2022-08-10 MED ORDER — ENOXAPARIN SODIUM 40 MG/0.4ML IJ SOSY
40.0000 mg | PREFILLED_SYRINGE | INTRAMUSCULAR | Status: DC
  Administered 2022-08-11 – 2022-08-13 (×3): 40 mg via SUBCUTANEOUS
  Filled 2022-08-10 (×3): qty 0.4

## 2022-08-10 MED ORDER — SIMETHICONE 80 MG PO CHEW
80.0000 mg | CHEWABLE_TABLET | Freq: Three times a day (TID) | ORAL | Status: DC
  Administered 2022-08-11 – 2022-08-13 (×7): 80 mg via ORAL
  Filled 2022-08-10 (×7): qty 1

## 2022-08-10 MED ORDER — HYDROMORPHONE HCL 1 MG/ML IJ SOLN: 0.2500 mg | INTRAMUSCULAR | Status: DC | PRN

## 2022-08-10 MED ORDER — DEXAMETHASONE SODIUM PHOSPHATE 10 MG/ML IJ SOLN
INTRAMUSCULAR | Status: DC | PRN
  Administered 2022-08-10: 10 mg via INTRAVENOUS

## 2022-08-10 MED ORDER — STERILE WATER FOR IRRIGATION IR SOLN
Status: DC | PRN
  Administered 2022-08-10: 1

## 2022-08-10 MED ORDER — ACETAMINOPHEN 10 MG/ML IV SOLN
INTRAVENOUS | Status: DC | PRN
  Administered 2022-08-10: 1000 mg via INTRAVENOUS

## 2022-08-10 MED ORDER — OXYTOCIN-SODIUM CHLORIDE 30-0.9 UT/500ML-% IV SOLN
INTRAVENOUS | Status: AC
  Filled 2022-08-10: qty 500

## 2022-08-10 MED ORDER — IBUPROFEN 600 MG PO TABS
600.0000 mg | ORAL_TABLET | Freq: Four times a day (QID) | ORAL | Status: DC
  Administered 2022-08-12 – 2022-08-13 (×5): 600 mg via ORAL
  Filled 2022-08-10 (×5): qty 1

## 2022-08-10 MED ORDER — ACETAMINOPHEN 10 MG/ML IV SOLN
INTRAVENOUS | Status: AC
  Filled 2022-08-10: qty 100

## 2022-08-10 MED ORDER — TRANEXAMIC ACID-NACL 1000-0.7 MG/100ML-% IV SOLN
INTRAVENOUS | Status: AC
  Filled 2022-08-10: qty 100

## 2022-08-10 MED ORDER — NALOXONE HCL 4 MG/10ML IJ SOLN: 1.0000 ug/kg/h | INTRAVENOUS | Status: DC | PRN

## 2022-08-10 MED ORDER — PRENATAL MULTIVITAMIN CH
1.0000 | ORAL_TABLET | Freq: Every day | ORAL | Status: DC
  Administered 2022-08-11 – 2022-08-12 (×2): 1 via ORAL
  Filled 2022-08-10 (×2): qty 1

## 2022-08-10 MED ORDER — PHENYLEPHRINE HCL-NACL 20-0.9 MG/250ML-% IV SOLN
INTRAVENOUS | Status: AC
  Filled 2022-08-10: qty 250

## 2022-08-10 MED ORDER — DIPHENHYDRAMINE HCL 50 MG/ML IJ SOLN
12.5000 mg | INTRAMUSCULAR | Status: DC | PRN
  Administered 2022-08-11 (×2): 12.5 mg via INTRAVENOUS
  Filled 2022-08-10 (×2): qty 1

## 2022-08-10 MED ORDER — TRANEXAMIC ACID-NACL 1000-0.7 MG/100ML-% IV SOLN
INTRAVENOUS | Status: DC | PRN
  Administered 2022-08-10: 1000 mg via INTRAVENOUS

## 2022-08-10 MED ORDER — MEPERIDINE HCL 25 MG/ML IJ SOLN: 6.2500 mg | INTRAMUSCULAR | Status: DC | PRN

## 2022-08-10 MED ORDER — OXYTOCIN-SODIUM CHLORIDE 30-0.9 UT/500ML-% IV SOLN
INTRAVENOUS | Status: DC | PRN
  Administered 2022-08-10: 30 [IU] via INTRAVENOUS

## 2022-08-10 MED ORDER — SODIUM CHLORIDE 0.9% FLUSH: 3.0000 mL | INTRAVENOUS | Status: DC | PRN

## 2022-08-10 MED ORDER — KETOROLAC TROMETHAMINE 30 MG/ML IJ SOLN
INTRAMUSCULAR | Status: AC
  Filled 2022-08-10: qty 1

## 2022-08-10 MED ORDER — MORPHINE SULFATE (PF) 0.5 MG/ML IJ SOLN
INTRAMUSCULAR | Status: AC
  Filled 2022-08-10: qty 10

## 2022-08-10 MED ORDER — OXYCODONE HCL 5 MG/5ML PO SOLN: 5.0000 mg | Freq: Once | ORAL | Status: DC | PRN

## 2022-08-10 MED ORDER — DIPHENHYDRAMINE HCL 25 MG PO CAPS
25.0000 mg | ORAL_CAPSULE | Freq: Four times a day (QID) | ORAL | Status: DC | PRN
  Administered 2022-08-11 (×2): 25 mg via ORAL
  Filled 2022-08-10: qty 1

## 2022-08-10 MED ORDER — MAGNESIUM HYDROXIDE 400 MG/5ML PO SUSP: 30.0000 mL | ORAL | Status: DC | PRN

## 2022-08-10 MED ORDER — FENTANYL CITRATE (PF) 100 MCG/2ML IJ SOLN
INTRAMUSCULAR | Status: AC
  Filled 2022-08-10: qty 2

## 2022-08-10 MED ORDER — BUPIVACAINE IN DEXTROSE 0.75-8.25 % IT SOLN
INTRATHECAL | Status: DC | PRN
  Administered 2022-08-10: 12 mg via INTRATHECAL

## 2022-08-10 MED ORDER — ZOLPIDEM TARTRATE 5 MG PO TABS: 5.0000 mg | ORAL_TABLET | Freq: Every evening | ORAL | Status: DC | PRN

## 2022-08-10 MED ORDER — ACETAMINOPHEN 500 MG PO TABS
1000.0000 mg | ORAL_TABLET | Freq: Four times a day (QID) | ORAL | Status: DC
  Administered 2022-08-11 – 2022-08-13 (×10): 1000 mg via ORAL
  Filled 2022-08-10 (×10): qty 2

## 2022-08-10 MED ORDER — BUPIVACAINE IN DEXTROSE 0.75-8.25 % IT SOLN
INTRATHECAL | Status: AC
  Filled 2022-08-10: qty 2

## 2022-08-10 MED ORDER — KETOROLAC TROMETHAMINE 30 MG/ML IJ SOLN
30.0000 mg | Freq: Four times a day (QID) | INTRAMUSCULAR | Status: AC
  Administered 2022-08-11 (×4): 30 mg via INTRAVENOUS
  Filled 2022-08-10 (×4): qty 1

## 2022-08-10 MED ORDER — OXYTOCIN-SODIUM CHLORIDE 30-0.9 UT/500ML-% IV SOLN: 2.5000 [IU]/h | INTRAVENOUS | Status: AC

## 2022-08-10 SURGICAL SUPPLY — 34 items
APL PRP STRL LF DISP 70% ISPRP (MISCELLANEOUS) ×2
APL SKNCLS STERI-STRIP NONHPOA (GAUZE/BANDAGES/DRESSINGS) ×1
BENZOIN TINCTURE PRP APPL 2/3 (GAUZE/BANDAGES/DRESSINGS) IMPLANT
CHLORAPREP W/TINT 26 (MISCELLANEOUS) ×4 IMPLANT
CLAMP UMBILICAL CORD (MISCELLANEOUS) ×2 IMPLANT
CLOSURE STERI STRIP 1/2 X4 (GAUZE/BANDAGES/DRESSINGS) IMPLANT
CLOTH BEACON ORANGE TIMEOUT ST (SAFETY) ×2 IMPLANT
DRSG OPSITE POSTOP 4X10 (GAUZE/BANDAGES/DRESSINGS) ×2 IMPLANT
ELECT REM PT RETURN 9FT ADLT (ELECTROSURGICAL) ×1
ELECTRODE REM PT RTRN 9FT ADLT (ELECTROSURGICAL) ×2 IMPLANT
EXTRACTOR VACUUM KIWI (MISCELLANEOUS) IMPLANT
GLOVE SURG ORTHO 8.0 STRL STRW (GLOVE) ×2 IMPLANT
GOWN STRL REUS W/TWL LRG LVL3 (GOWN DISPOSABLE) ×4 IMPLANT
KIT ABG SYR 3ML LUER SLIP (SYRINGE) IMPLANT
NDL HYPO 25X5/8 SAFETYGLIDE (NEEDLE) IMPLANT
NEEDLE HYPO 25X5/8 SAFETYGLIDE (NEEDLE) IMPLANT
NS IRRIG 1000ML POUR BTL (IV SOLUTION) ×2 IMPLANT
PACK C SECTION WH (CUSTOM PROCEDURE TRAY) ×2 IMPLANT
PAD OB MATERNITY 4.3X12.25 (PERSONAL CARE ITEMS) ×2 IMPLANT
RTRCTR C-SECT PINK 25CM LRG (MISCELLANEOUS) IMPLANT
STRIP CLOSURE SKIN 1/2X4 (GAUZE/BANDAGES/DRESSINGS) IMPLANT
SUT MON AB-0 CT1 36 (SUTURE) ×4 IMPLANT
SUT PLAIN 0 NONE (SUTURE) IMPLANT
SUT PLAIN 2 0 (SUTURE) ×1
SUT PLAIN ABS 2-0 CT1 27XMFL (SUTURE) IMPLANT
SUT VIC AB 0 CT1 27 (SUTURE) ×2
SUT VIC AB 0 CT1 27XBRD ANBCTR (SUTURE) ×4 IMPLANT
SUT VIC AB 2-0 CT1 27 (SUTURE) ×1
SUT VIC AB 2-0 CT1 TAPERPNT 27 (SUTURE) ×2 IMPLANT
SUT VIC AB 4-0 SH 27 (SUTURE) ×1
SUT VIC AB 4-0 SH 27XANBCTRL (SUTURE) ×2 IMPLANT
TOWEL OR 17X24 6PK STRL BLUE (TOWEL DISPOSABLE) ×2 IMPLANT
TRAY FOLEY W/BAG SLVR 14FR LF (SET/KITS/TRAYS/PACK) ×2 IMPLANT
WATER STERILE IRR 1000ML POUR (IV SOLUTION) ×2 IMPLANT

## 2022-08-10 NOTE — MAU Note (Signed)
Lacey Ford is a 36 y.o. at [redacted]w[redacted]d here in [facility]U reporting: sent over from [facility] office for evaluation of BP.  Denies H/A, blurred vision, and epigastric pain.  Reports had H/A approx 1.5 hours ago, had it for 3 days.    Denies VB or LOF.  Endorses +FM. LMP: N/A Onset of complaint: today Pain score: 0 Vitals:   08/10/22 1546  BP: 129/88  Pulse: (!) 110  Resp: 20  Temp: (!) 97.4 F (36.3 C)  SpO2: 98%     IWL:[id number] d/t maternal apparel Lab orders placed from triage:   UA

## 2022-08-10 NOTE — Op Note (Signed)
Lacey Ford PROCEDURE DATE: 08/10/2022  PREOPERATIVE DIAGNOSES: Intrauterine pregnancy at [redacted]w[redacted]d weeks gestation;  Low lying placenta , 1 cm from cervical os  POSTOPERATIVE DIAGNOSES: The same, viable infant delivered  PROCEDURE: PrimaryLow Transverse Cesarean Section  SURGEON:  Dr. Mariel Aloe  ASSISTANT:  Myrtie Hawk, DO An experienced assistant was required given the standard of surgical care given the complexity of the case.  This assistant was needed for exposure, dissection, suctioning, retraction, instrument exchange, assisting with delivery with administration of fundal pressure, and for overall help during the procedure.  ANESTHESIOLOGY TEAM: Anesthesiologist: Trevor Iha, MD CRNA: Orlie Pollen, CRNA  INDICATIONS: Lacey Ford is a 36 y.o. [phone] at [redacted]w[redacted]d here for cesarean section secondary to the indications listed under preoperative diagnoses; please see preoperative note for further details.  The risks of surgery were discussed with the patient including but were not limited to: bleeding which may require transfusion or reoperation; infection which may require antibiotics; injury to bowel, bladder, ureters or other surrounding organs; injury to the fetus; need for additional procedures including hysterectomy in the event of a life-threatening hemorrhage; formation of adhesions; placental abnormalities wth subsequent pregnancies; incisional problems; thromboembolic phenomenon and other postoperative/anesthesia complications.  The patient concurred with the proposed plan, giving informed written consent for the procedure.    FINDINGS:  Viable female infant in cephalic presentation.  Apgars 8 and 9.  Amniotic fluid: clear.  Intact placenta, three vessel cord.  Normal uterus, fallopian tubes and ovaries bilaterally.  ANESTHESIA: spinal INTRAVENOUS FLUIDS: 1500 ml   Quantified BLOOD LOSS: 635 ml URINE OUTPUT:  200 ml SPECIMENS: Placenta sent to L&D  . COMPLICATIONS: None immediate  PROCEDURE IN DETAIL:  The patient preoperatively received intravenous antibiotics and had sequential compression devices applied to her lower extremities.  She was then taken to the operating room where spinal anesthesia was found to be adequate. She was then placed in a dorsal supine position with a leftward tilt, and prepped and draped in a sterile manner.  A foley catheter was  placed into her bladder and attached to constant gravity.  After an adequate timeout was performed, a Pfannenstiel skin incision was made with scalpel and carried through to the underlying layer of fascia. The fascia was incised in the midline, and this incision was extended bluntly. The rectus muscles were separated in the midline and the peritoneum was entered bluntly.   The Alexis self-retaining retractor was introduced into the abdominal cavity.  Attention was turned to the lower uterine segment where a low transverse hysterotomy was made with a scalpel and extended bluntly in caudad and cephalad directions.  Placenta noted to be anterior. Infant occiput grasped through the placenta. The infant was successfully delivered, the cord was clamped and cut after 30 seconds due to noted bleeding and unclear if was due to placental abruption or uterine. The infant was handed over to the awaiting neonatology team. Uterine massage was then administered, and the placenta delivered with a three-vessel cord. The uterus was then cleared of clots and debris.  The hysterotomy was closed with 0-Monocryl in a running fashion.   The pelvis was cleared of all clot and debris. Hemostasis was confirmed on all surfaces.  The uterine incision was once again inspected and found to be hemostatic. The retractor was removed.  The peritoneum was closed with a 2-0 Vicryl running stitch. The fascia was then closed using 0 Vicryl in a running fashion.  The subcutaneous layer was irrigated, any areas of bleeding  were  cauterized with the bovie,  was reapproximated with 2-0 plain gut in a running fashion, was found to be hemostatic. The skin was closed with a 4-0 Vicryl subcuticular stitch. The patient tolerated the procedure well. Sponge, instrument and needle counts were correct x 3.  She was taken to the recovery room in stable condition.   Myrtie Hawk, DO FMOB Fellow, Faculty practice [facility], Center for [facility] 08/10/22  9:39 PM

## 2022-08-10 NOTE — H&P (Signed)
Faculty Practice H&P  Lacey Ford is a 36 y.o. female [phone] with IUP at [redacted]w[redacted]d presenting from [facility] office for elevated BP ultimately requiring cesarean section for low lying placenta 1 cm from cervical os.  Discussed the low lying placenta with Dr. Parke Poisson MFM and he suggested primary c section to decrease the risk of hemorrhage. Pregnancy was been complicated by limited prenatal care, placenta previa, e coli pyelonephritis (6/20), rubell non immune.  Pt also admits to smoking 1ppd ,sporadic methamphetamine and THC use.  Pt states she has not been having  contractions,  vaginal bleeding, reports intact membranes, with normal fetal movement.     Prenatal Course Source of Care:  [facility]  with onset of care at 31 weeks  Pregnancy complications or risks: Patient Active Problem List   Diagnosis Date Noted   Low lying placenta nos or without hemorrhage, third trimester 08/10/2022   Supervision of high risk pregnancy, antepartum 06/27/2022   Unwanted fertility 06/27/2022   Placenta previa 05/16/2022   Preterm delivery, delivered 05/30/2019   Gestational hypertension 05/30/2019   She is unsure about contraception. She had previously signed a BTL form in July, but now declines the procedure. She plans to bottle feed  Prenatal labs and studies: ABO, Rh: --/--/PENDING (09/13 1850) Antibody: PENDING (09/13 1850) Rubella: <0.90 (06/20 0443) RPR: NON REACTIVE (06/20 0443)  HBsAg: NON REACTIVE (06/20 0443)  HIV: NON REACTIVE (06/20 0443)  GBS:    2hr Glucola: Not done  Genetic screening:  Not completed Anatomy US: abnormal with placenta previa > low lying placenta 1 cm from cervical os  Past Medical History:  Past Medical History:  Diagnosis Date   Preeclampsia 05/30/2019    Past Surgical History: History reviewed. No pertinent surgical history.  Obstetrical History:  OB History     Gravida  7   Para  3   Term  2   Preterm  1   AB  3   Living  3      SAB  2   IAB  1   Ectopic       Multiple      Live Births  3           Gynecological History:  OB History     Gravida  7   Para  3   Term  2   Preterm  1   AB  3   Living  3      SAB  2   IAB  1   Ectopic      Multiple      Live Births  3           Social History:  Social History   Socioeconomic History   Marital status: Single    Spouse name: Not on file   Number of children: Not on file   Years of education: Not on file   Highest education level: Not on file  Occupational History   Not on file  Tobacco Use   Smoking status: Every Day    Packs/day: 1.00    Types: Cigarettes   Smokeless tobacco: Never  Substance and Sexual Activity   Alcohol use: Not Currently    Comment: Occasional   Drug use: Yes    Types: Marijuana   Sexual activity: Yes  Other Topics Concern   Not on file  Social History Narrative   Not on file   Social Determinants of Health   Financial Resource Strain: Not on file  Food Insecurity: Not on file  Transportation Needs: Not on file  Physical Activity: Not on file  Stress: Not on file  Social Connections: Not on file    Family History:  Family History  Problem Relation Age of Onset   Epilepsy Mother     Medications:  Prenatal vitamins,  Current Facility-Administered Medications  Medication Dose Route Frequency Provider Last Rate Last Admin   0.9 %  sodium chloride infusion (Manually program via Guardrails IV Fluids)   Intravenous Once Warden Fillers, MD       0.9 %  sodium chloride infusion (Manually program via Guardrails IV Fluids)   Intravenous Once Lorn Junes, Debra R, CRNA       ceFAZolin (ANCEF) IVPB 2g/100 mL premix  2 g Intravenous 30 min Pre-Op Warden Fillers, MD       lactated ringers infusion   Intravenous Continuous Warden Fillers, MD 100 mL/hr at 08/10/22 1908 New Bag at 08/10/22 1908   sodium citrate-citric acid (ORACIT) solution 30 mL  30 mL Oral 30 min Pre-Op Warden Fillers, MD        Allergies: No Known  Allergies  Review of Systems: - History obtained from the patient  Physical Exam: Blood pressure 118/73, pulse 94, temperature (!) 97.4 F (36.3 C), temperature source Oral, resp. rate 20, height 5\' 5"  (1.651 m), weight 72.2 kg, SpO2 98 %, unknown if currently breastfeeding. GENERAL: Well-developed, well-nourished female in no acute distress.  LUNGS: Clear to auscultation bilaterally.  HEART: Regular rate and rhythm. ABDOMEN: Soft, nontender, nondistended, gravid.  EXTREMITIES: Nontender, no edema, 2+ distal pulses. Cervical Exam: deferred  Presentation: cephalic FHT:  Baseline rate 130s bpm   Variability moderate  Accelerations present   Decelerations none Contractions: irritability   Pertinent Labs/Studies:   Lab Results  Component Value Date   WBC 9.2 08/10/2022   HGB 9.7 (L) 08/10/2022   HCT 29.9 (L) 08/10/2022   MCV 76.9 (L) 08/10/2022   PLT 214 08/10/2022    Assessment : Delinda Malan is a 36 y.o. 31 at [redacted]w[redacted]d being admitted for cesarean section secondary to low lying placenta.  Plan: The risks of cesarean section discussed with the patient included but were not limited to: bleeding which may require transfusion or reoperation; infection which may require antibiotics; injury to bowel, bladder, ureters or other surrounding organs; injury to the fetus; need for additional procedures including hysterectomy in the event of a life-threatening hemorrhage; placental abnormalities wth subsequent pregnancies, incisional problems, thromboembolic phenomenon and other postoperative/anesthesia complications. The patient concurred with the proposed plan, giving informed written consent for the procedure.   Patient has been NPO since 10 am and will remain NPO for procedure.  Preoperative prophylactic Ancef ordered on call to the OR.    [redacted]w[redacted]d, MD, 08/10/2022, 7:26 PM

## 2022-08-10 NOTE — Transfer of Care (Signed)
Immediate Anesthesia Transfer of Care Note  Patient: Lacey Ford  Procedure(s) Performed: CESAREAN SECTION  Patient Location: PACU  Anesthesia Type:Spinal  Level of Consciousness: awake, alert , oriented and patient cooperative  Airway & Oxygen Therapy: Patient Spontanous Breathing  Post-op Assessment: Report given to RN and Post -op Vital signs reviewed and stable  Post vital signs: Reviewed and stable  Last Vitals:  Vitals Value Taken Time  BP 103/67 08/10/22 2131  Temp    Pulse 83 08/10/22 2133  Resp 16 08/10/22 2133  SpO2 100 % 08/10/22 2133  Vitals shown include unvalidated device data.  Last Pain:  Vitals:   08/10/22 1546  TempSrc: Oral  PainSc:          Complications: No notable events documented.

## 2022-08-10 NOTE — Patient Instructions (Signed)

## 2022-08-10 NOTE — Progress Notes (Signed)
   Subjective:  Lacey Ford is a 36 y.o. [id number] at [redacted]w[redacted]d being seen today for ongoing prenatal care.  She is currently monitored for the following issues for this high-risk pregnancy and has Preterm delivery, delivered; Gestational hypertension; Placenta previa; Supervision of high risk pregnancy, antepartum; and Unwanted fertility on their problem list.  Patient reports  headache for three days and feeling unwell with a cold .  Contractions: Irritability. Vag. Bleeding: None.  Movement: Present. Denies leaking of fluid.   The following portions of the patient's history were reviewed and updated as appropriate: allergies, current medications, past family history, past medical history, past social history, past surgical history and problem list. Problem list updated.  Objective:   Vitals:   08/10/22 1420  BP: (!) 150/90  Pulse: (!) 111    Fetal Status: Fetal Heart Rate (bpm): 144   Movement: Present     General:  Alert, oriented and cooperative. Patient is in no acute distress.  Skin: Skin is warm and dry. No rash noted.   Cardiovascular: Normal heart rate noted  Respiratory: Normal respiratory effort, no problems with respiration noted  Abdomen: Soft, gravid, appropriate for gestational age. Pain/Pressure: Present     Pelvic: Vag. Bleeding: None     Cervical exam deferred        Extremities: Normal range of motion.  Edema: Trace  Mental Status: Normal mood and affect. Normal behavior. Normal judgment and thought content.   Urinalysis:      Assessment and Plan:  Pregnancy: [id number] at [redacted]w[redacted]d  1. Supervision of high risk pregnancy, antepartum BP elevated Now meets criteria for gHTN Swabs not collected Reports brother died about a month ago and has been living in [place], just got back to [place] area  2. Unwanted fertility Signed BTL papers on 06/27/2022  3. Placenta previa in third trimester No scan since 05/16/2022 so don't know if it has migrated or not Has not had any  bleeding Sent to MAU for scan to see if cesarean is indicated MAU staff notified by secure chat  4. Gestational hypertension, third trimester At this point this is her second indication for delivery Possible PreE as she has had a headache for past 3 days Discussed this with her, sent to MAU  Term labor symptoms and general obstetric precautions including but not limited to vaginal bleeding, contractions, leaking of fluid and fetal movement were reviewed in detail with the patient. Please refer to After Visit Summary for other counseling recommendations.  Return in 1 week (on 08/17/2022) for [facility], ob visit.   Venora Maples, MD

## 2022-08-10 NOTE — Anesthesia Procedure Notes (Signed)
Spinal  Patient location during procedure: OB Start time: 08/10/2022 8:11 PM End time: 08/10/2022 8:15 PM Staffing Anesthesiologist: Trevor Iha, MD Performed by: Trevor Iha, MD Authorized by: Trevor Iha, MD   Preanesthetic Checklist Completed: patient identified, IV checked, site marked, risks and benefits discussed, surgical consent, monitors and equipment checked, pre-op evaluation and timeout performed Spinal Block Patient position: sitting Prep: DuraPrep Patient monitoring: cardiac monitor, continuous pulse ox, blood pressure and heart rate Approach: midline Location: L3-4 Injection technique: catheter Needle Needle type: Tuohy and Sprotte  Needle gauge: 27 G Needle length: 12.7 cm Needle insertion depth: 5 cm Catheter type: closed end flexible Catheter size: 19 g Catheter at skin depth: 11 cm Assessment Sensory level: T4 Additional Notes 1 attempt. Pt tolerates procedure well.

## 2022-08-10 NOTE — Discharge Summary (Signed)
Postpartum Discharge Summary  Date of Service updated 08/13/22   Patient Name: Lacey Ford DOB: 10/07/1986 MRN: [id number]  Date of admission: 08/10/2022 Delivery date:08/10/2022  Delivering provider: Griffin Basil  Date of discharge: 08/13/2022  Admitting diagnosis: Low lying placenta nos or without hemorrhage, third trimester [O44.43] Delivery of pregnancy by cesarean section [O82] Intrauterine pregnancy: [redacted]w[redacted]d    Secondary diagnosis:  Principal Problem:   Delivery of pregnancy by cesarean section Active Problems:   Gestational hypertension   Low lying placenta nos or without hemorrhage, third trimester   Limited prenatal care   Drug use affecting pregnancy  Additional problems: n/a    Discharge diagnosis: Term Pregnancy Delivered and Gestational Hypertension                                              Post partum procedures: IV venofer for hgb of 8.0 Augmentation: N/A Complications: None  Hospital course: Sceduled C/S   36y.o. yo [id number] admitted to the hospital 08/10/2022 for scheduled cesarean section with the following indication: low lying placenta .Delivery details are as follows:  Membrane Rupture Time/Date: 8:39 PM ,08/10/2022   Delivery Method:C-Section, Low Transverse  Details of operation can be found in separate operative note.  Patient had an uncomplicated postpartum course. SW involved. She is ambulating, tolerating a regular diet, passing flatus, and urinating well. Patient is discharged home in stable condition on  08/13/22        Newborn Data: Birth date:08/10/2022  Birth time:8:39 PM  Gender:Female  Living status:Living  Apgars:8 ,9  Weight:3000 g     Magnesium Sulfate received: No BMZ received: No Rhophylac:N/A MMQAE:SLPNPYYostnatally T-DaP:Given prenatally Flu: N/A Transfusion:Yes  Physical exam  Vitals:   08/12/22 0503 08/12/22 1514 08/12/22 2119 08/13/22 0626  BP: (!) 114/59 124/83 127/77 117/71  Pulse: 82 91 85 84  Resp: 18  17 18 18   Temp: 98.4 F (36.9 C) (!) 97.4 F (36.3 C) 97.9 F (36.6 C) 97.9 F (36.6 C)  TempSrc: Oral Oral Oral Oral  SpO2:  100%    Weight:      Height:       General: alert, cooperative, and no distress Lochia: appropriate Uterine Fundus: firm Incision: Healing well with no significant drainage, No significant erythema, Dressing is clean, dry, and intact DVT Evaluation: Calf/Ankle edema is present Labs: Lab Results  Component Value Date   WBC 9.5 08/11/2022   HGB 8.0 (L) 08/11/2022   HCT 25.0 (L) 08/11/2022   MCV 78.4 (L) 08/11/2022   PLT 184 08/11/2022      Latest Ref Rng & Units 08/10/2022    3:29 PM  CMP  Glucose 70 - 99 mg/dL 93   BUN 6 - 20 mg/dL 7   Creatinine 0.44 - 1.00 mg/dL 0.56   Sodium 135 - 145 mmol/L 135   Potassium 3.5 - 5.1 mmol/L 3.9   Chloride 98 - 111 mmol/L 106   CO2 22 - 32 mmol/L 20   Calcium 8.9 - 10.3 mg/dL 8.5   Total Protein 6.5 - 8.1 g/dL 6.8   Total Bilirubin 0.3 - 1.2 mg/dL 0.4   Alkaline Phos 38 - 126 U/L 193   AST 15 - 41 U/L 15   ALT 0 - 44 U/L 16    Edinburgh Score:    08/10/2022   10:45 PM  EdFlavia Shipper  Postnatal Depression Scale Screening Tool  I have been able to laugh and see the funny side of things. 0  I have looked forward with enjoyment to things. 0  I have blamed myself unnecessarily when things went wrong. 1  I have been anxious or worried for no good reason. 2  I have felt scared or panicky for no good reason. 1  Things have been getting on top of me. 0  I have been so unhappy that I have had difficulty sleeping. 0  I have felt sad or miserable. 2  I have been so unhappy that I have been crying. 1  The thought of harming myself has occurred to me. 0  Edinburgh Postnatal Depression Scale Total 7     After visit meds:  Allergies as of 08/13/2022   No Known Allergies      Medication List     STOP taking these medications    cefadroxil 500 MG capsule Commonly known as: DURICEF   cephALEXin 250 MG  capsule Commonly known as: Keflex   cetirizine 10 MG tablet Commonly known as: ZyrTEC Allergy   potassium chloride SA 20 MEQ tablet Commonly known as: KLOR-CON M       TAKE these medications    acetaminophen 325 MG tablet Commonly known as: TYLENOL Take 2 tablets (650 mg total) by mouth every 4 (four) hours as needed for fever (for pain scale < 4  OR  temperature  >/=  100.5 F).   ibuprofen 600 MG tablet Commonly known as: ADVIL Take 1 tablet (600 mg total) by mouth every 6 (six) hours.   oxyCODONE 5 MG immediate release tablet Commonly known as: Oxy IR/ROXICODONE Take 1-2 tablets (5-10 mg total) by mouth every 4 (four) hours as needed for moderate pain.   Prenatal 27-1 MG Tabs Take 1 tablet by mouth daily at 12 noon.         Discharge home in stable condition Infant Feeding: Bottle Infant Disposition:home with mother Discharge instruction: per After Visit Summary and Postpartum booklet. Activity: Advance as tolerated. Pelvic rest for 6 weeks.  Diet: routine diet Future Appointments: Future Appointments  Date Time Provider [place]  08/18/2022  9:20 AM [facility] NURSE [facility]  09/22/2022  8:35 AM Luvenia Redden, PA-C [facility]   Follow up Visit:  Message sent to [facility] on 08/13/22--Jakerra Q Mercado-Ortiz, DO  Please schedule this patient for a In person postpartum visit in 6 weeks with the following provider: Any provider. Additional Postpartum F/U:Incision check 1 week and BP check 1 week  High risk pregnancy complicated by: HTN Delivery mode:  C-Section, Low Transverse  Anticipated Birth Control:  Unsure   08/13/2022 Shelda Pal, DO

## 2022-08-10 NOTE — Anesthesia Preprocedure Evaluation (Signed)
Anesthesia Evaluation  Patient identified by MRN, date of birth, ID band Patient awake    Reviewed: Allergy & Precautions, NPO status , Patient's Chart, lab work & pertinent test results  Airway Mallampati: II  TM Distance: >3 FB Neck ROM: Full    Dental no notable dental hx. (+) Teeth Intact, Dental Advisory Given   Pulmonary Current Smoker and Patient abstained from smoking.,    Pulmonary exam normal breath sounds clear to auscultation       Cardiovascular hypertension (gHtn), Normal cardiovascular exam Rhythm:Regular Rate:Normal     Neuro/Psych    GI/Hepatic negative GI ROS, Neg liver ROS,   Endo/Other    Renal/GU negative Renal ROS     Musculoskeletal   Abdominal   Peds  Hematology  (+) Blood dyscrasia, anemia , Lab Results      Component                Value               Date                      WBC                      9.2                 08/10/2022                HGB                      9.7 (L)             08/10/2022                HCT                      29.9 (L)            08/10/2022                    PLT                      214                 08/10/2022              Anesthesia Other Findings   Reproductive/Obstetrics (+) Pregnancy Placenta previa                             Anesthesia Physical Anesthesia Plan  ASA: 3 and emergent  Anesthesia Plan: Spinal   Post-op Pain Management: Regional block* and Dilaudid IV   Induction: Intravenous  PONV Risk Score and Plan: Treatment may vary due to age or medical condition, Ondansetron and Dexamethasone  Airway Management Planned: Natural Airway, Simple Face Mask and Nasal Cannula  Additional Equipment: None  Intra-op Plan:   Post-operative Plan:   Informed Consent: I have reviewed the patients History and Physical, chart, labs and discussed the procedure including the risks, benefits and alternatives for the  proposed anesthesia with the patient or authorized representative who has indicated his/her understanding and acceptance.     Dental advisory given  Plan Discussed with:   Anesthesia Plan Comments: (38.1 wk G7P3 w gHtn and placent previa for rpt C/S  - 2 (18g) IVs annn)  Anesthesia Quick Evaluation  

## 2022-08-10 NOTE — MAU Provider Note (Signed)
History     CSN: [id number]  Arrival date and time: 08/10/22 1512   None     Chief Complaint  Patient presents with   BP Evaluation   HPI  Lacey Ford is a 36 y.o. female G7P2133 @ [redacted]w[redacted]d here in MAU with concerns about elevated BP readings in the office. She was sent from the office today. She has very limited prenatal care. She reports an ongoing headache for the past 3 days. Nothing seems to help her headache. The Headache actually spontaneously went away about 1 hour ago.  No vision changes.  [place] On 6/19 showed placenta previa. She does not have bleeding. + fetal movement.   BP 8/31: 155/92 BP 9/13: 150/90  OB History     Gravida  7   Para  3   Term  2   Preterm  1   AB  3   Living  3      SAB  2   IAB  1   Ectopic      Multiple      Live Births  3           Past Medical History:  Diagnosis Date   Preeclampsia 05/30/2019    History reviewed. No pertinent surgical history.  Family History  Problem Relation Age of Onset   Epilepsy Mother     Social History   Tobacco Use   Smoking status: Every Day    Packs/day: 1.00    Types: Cigarettes   Smokeless tobacco: Never  Substance Use Topics   Alcohol use: Not Currently    Comment: Occasional   Drug use: Yes    Types: Marijuana    Allergies: No Known Allergies  Medications Prior to Admission  Medication Sig Dispense Refill Last Dose   Prenatal Vit-Fe Fumarate-FA (PRENATAL MULTIVITAMIN) TABS tablet Take 1 tablet by mouth daily at 12 noon. 90 tablet 3 08/09/2022   acetaminophen (TYLENOL) 325 MG tablet Take 2 tablets (650 mg total) by mouth every 4 (four) hours as needed for fever (for pain scale < 4  OR  temperature  >/=  100.5 F).      cefadroxil (DURICEF) 500 MG capsule Take 1 capsule (500 mg total) by mouth 2 (two) times daily. 14 capsule 0    cephALEXin (KEFLEX) 250 MG capsule Take 1 capsule (250 mg total) by mouth daily. For suppression - start this after the cefadrozil is completed  until delivery 30 capsule 3    cetirizine (ZYRTEC ALLERGY) 10 MG tablet Take 1 tablet (10 mg total) by mouth at bedtime. 30 tablet 0    potassium chloride SA (KLOR-CON M) 20 MEQ tablet Take 2 tablets (40 mEq total) by mouth 2 (two) times daily for 2 days. 8 tablet 0    Results for orders placed or performed during the hospital encounter of 08/10/22 (from the past 48 hour(s))  CBC with Differential/Platelet     Status: Abnormal   Collection Time: 08/10/22  3:29 PM  Result Value Ref Range   WBC 9.2 4.0 - 10.5 K/uL   RBC 3.89 3.87 - 5.11 MIL/uL   Hemoglobin 9.7 (L) 12.0 - 15.0 g/dL   HCT 14.4 (L) 31.5 - 40.0 %   MCV 76.9 (L) 80.0 - 100.0 fL   MCH 24.9 (L) 26.0 - 34.0 pg   MCHC 32.4 30.0 - 36.0 g/dL   RDW 86.7 61.9 - 50.9 %   Platelets 214 150 - 400 K/uL   nRBC 0.0 0.0 - 0.2 %  Neutrophils Relative % 71 %   Neutro Abs 6.6 1.7 - 7.7 K/uL   Lymphocytes Relative 18 %   Lymphs Abs 1.6 0.7 - 4.0 K/uL   Monocytes Relative 9 %   Monocytes Absolute 0.8 0.1 - 1.0 K/uL   Eosinophils Relative 1 %   Eosinophils Absolute 0.1 0.0 - 0.5 K/uL   Basophils Relative 1 %   Basophils Absolute 0.1 0.0 - 0.1 K/uL   Immature Granulocytes 0 %   Abs Immature Granulocytes 0.03 0.00 - 0.07 K/uL    Comment: Performed at [facility], [address] [place], [place] [address]  Comprehensive metabolic panel     Status: Abnormal   Collection Time: 08/10/22  3:29 PM  Result Value Ref Range   Sodium 135 135 - 145 mmol/L   Potassium 3.9 3.5 - 5.1 mmol/L   Chloride 106 98 - 111 mmol/L   CO2 20 (L) 22 - 32 mmol/L   Glucose, Bld 93 70 - 99 mg/dL    Comment: Glucose reference range applies only to samples taken after fasting for at least 8 hours.   BUN 7 6 - 20 mg/dL   Creatinine, Ser 1.63 0.44 - 1.00 mg/dL   Calcium 8.5 (L) 8.9 - 10.3 mg/dL   Total Protein 6.8 6.5 - 8.1 g/dL   Albumin 2.4 (L) 3.5 - 5.0 g/dL   AST 15 15 - 41 U/L   ALT 16 0 - 44 U/L   Alkaline Phosphatase 193 (H) 38 - 126 U/L   Total  Bilirubin 0.4 0.3 - 1.2 mg/dL   GFR, Estimated >84 >66 mL/min    Comment: (NOTE) Calculated using the CKD-EPI Creatinine Equation (2021)    Anion gap 9 5 - 15    Comment: Performed at [facility], [address], [place], [place] [address]    Review of Systems  Constitutional:  Negative for fever.  Eyes:  Negative for photophobia and visual disturbance.  Neurological:  Negative for headaches.   Physical Exam   Blood pressure 118/73, pulse 94, temperature (!) 97.4 F (36.3 C), temperature source Oral, resp. rate 20, height 5\' 5"  (1.651 m), weight 72.2 kg, SpO2 98 %, unknown if currently breastfeeding.  Patient Vitals for the past 24 hrs:  BP Temp Temp src Pulse Resp SpO2 Height Weight  08/10/22 1730 118/73 -- -- 94 -- 98 % -- --  08/10/22 1725 -- -- -- -- -- 97 % -- --  08/10/22 1720 -- -- -- -- -- 98 % -- --  08/10/22 1715 126/80 -- -- 97 -- 97 % -- --  08/10/22 1710 -- -- -- -- -- 98 % -- --  08/10/22 1705 -- -- -- -- -- 97 % -- --  08/10/22 1700 122/74 -- -- 85 -- 97 % -- --  08/10/22 1656 115/74 -- -- 98 -- -- -- --  08/10/22 1610 -- -- -- -- -- 97 % -- --  08/10/22 1605 -- -- -- -- -- 98 % -- --  08/10/22 1600 -- -- -- -- -- 98 % -- --  08/10/22 1559 133/86 -- -- 100 -- -- -- --  08/10/22 1546 129/88 (!) 97.4 F (36.3 C) Oral (!) 110 20 98 % -- --  08/10/22 1542 -- -- -- -- -- -- 5\' 5"  (1.651 m) 72.2 kg     Physical Exam Constitutional:      General: She is not in acute distress.    Appearance: Normal appearance. She is  not ill-appearing, toxic-appearing or diaphoretic.  Pulmonary:     Effort: Pulmonary effort is normal.  Skin:    General: Skin is warm.  Neurological:     Mental Status: She is alert and oriented to person, place, and time.  Psychiatric:        Behavior: Behavior normal.    MAU Course  Procedures  MDM  Repeat US to evaluate Placenta PIH labs ordered HA now 0/10 Called and discussed [place] report with Dr. Donavan Foil Category 1 fetal tracing.    Assessment and Plan   A:  1. Low lying placenta nos or without hemorrhage, third trimester   2. Gestational hypertension, antepartum   3. [redacted] weeks gestation of pregnancy      P:  Plan for C/s per Dr. Minda Meo, Harolyn Rutherford, NP. 08/10/2022 6:44 PM

## 2022-08-10 NOTE — Lactation Note (Signed)
This note was copied from a baby's chart. Lactation Consultation Note  Patient Name: Lacey Ford TRRNH'A Date: 08/10/2022   Age:36 hours Formula is feeding of choice.  Maternal Data    Feeding    LATCH Score                    Lactation Tools Discussed/Used    Interventions    Discharge    Consult Status Consult Status: Complete    Larin Weissberg G 08/10/2022, 10:03 PM

## 2022-08-11 ENCOUNTER — Encounter (HOSPITAL_COMMUNITY): Payer: Self-pay | Admitting: Obstetrics and Gynecology

## 2022-08-11 LAB — CBC
HCT: 25 % — ABNORMAL LOW (ref 36.0–46.0)
Hemoglobin: 8 g/dL — ABNORMAL LOW (ref 12.0–15.0)
MCH: 25.1 pg — ABNORMAL LOW (ref 26.0–34.0)
MCHC: 32 g/dL (ref 30.0–36.0)
MCV: 78.4 fL — ABNORMAL LOW (ref 80.0–100.0)
Platelets: 184 10*3/uL (ref 150–400)
RBC: 3.19 MIL/uL — ABNORMAL LOW (ref 3.87–5.11)
RDW: 14.9 % (ref 11.5–15.5)
WBC: 9.5 10*3/uL (ref 4.0–10.5)
nRBC: 0 % (ref 0.0–0.2)

## 2022-08-11 MED ORDER — SODIUM CHLORIDE 0.9 % IV SOLN
500.0000 mg | Freq: Once | INTRAVENOUS | Status: AC
  Administered 2022-08-11: 500 mg via INTRAVENOUS
  Filled 2022-08-11: qty 500

## 2022-08-11 NOTE — Clinical Social Work Maternal (Signed)
CLINICAL SOCIAL WORK MATERNAL/CHILD NOTE  Patient Details  Name: Tanairy Payeur MRN: [id number] Date of Birth: September 27, 1986  Date:  08/11/2022  Clinical Social Worker Initiating Note:  Kathrin Greathouse, [place] Date/Time: Initiated:  08/11/22/0915     Child's Name:  Yehuda Budd   Biological Parents:  Mother, Father (HDQ:QIWLNLG Laine [birth year] 1986)   Need for Interpreter:  None   Reason for Referral:  Current Substance Use/Substance Use During Pregnancy  , Late or No Prenatal Care     Address:  [place] [address]    Phone number:  [phone] (home)     Additional phone number:   Household Members/Support Persons (HM/SP):   Household Member/Support Person 1, Household Member/Support Person 2, Household Member/Support Person 3, Household Member/Support Person 4, Household Member/Support Person 5   HM/SP Name Relationship DOB or Age  HM/SP -1 Felicie Morn Daughter 09-26-2003  HM/SP -2 Dalbert Garnet Daughter's Friend 25y  HM/SP -3 Shelly Bombard Daugther's  Friend Daughter 8y  HM/SP -Grant Daughter 36month  HM[place]SP -[place] Friend 419y HM/SP -6        HM/SP -7        HM/SP -8          Natural Supports (not living in the home):      Professional Supports: None   Employment: Unemployed   Type of Work:     Education:  9 to 11 years   Homebound arranged: No  Financial Resources:  Self-Pay     Other Resources:      Cultural/Religious Considerations Which May Impact Care:    Strengths:  Ability to meet basic needs  , Home prepared for child     Psychotropic Medications:         Pediatrician:       Pediatrician List:   [place]     Pediatrician Fax Number:    Risk Factors/Current Problems:  Substance Use     Cognitive State:  Able to Concentrate  , Insightful  , Alert  , Linear Thinking     Mood/Affect:  Calm  , Interested      CSW Assessment: CSW received consult Limited prenatal care, sporadic drug use and tobacco use. CSW met with MOB to offer support and complete assessment.    CSW met with MOB at bedside and introduced CSW role. CSW observed MOB holding the infant, rubbing the infant's face, and appeared to be bonding appropriately. MOB presented calm and engaged with CSW during the visit. MOB stated that her daughter "JDorris Fetch had just left for work, and she was not expecting visitors to show during the assessment. CSW inquired if FOB is involved. MOB reported that she knows the child's father, however he will not be involved. MOB identified her daughter and daughter's best friend "LDoretha Sou as her primary supports. MOB reported she and her daughter are excited about the baby. MOB stated that she lives with her daughter, daughter's best friend, daughter's best friends' daughter, sister's daughter, and family friend (see chart above). She is currently unemployed and does not receive WIC/FS. She expressed interest in applying for both and knows how to follow up.   CSW inquired about MOB limited PNC with just two visits total ([id number] reported that she found out she was pregnant about two and half months ago while  she was admitted to [facility] for "E-coli poisoning." MOB stated that she had a bladder infection, septic and was admitted for three days. During this time her brother passed away, therefore she stayed busy with traveling back and forth from [place]. She reported that she had issues with reinstating her Medicaid as well. CSW offered to follow up with the [place] counselor about her Medicaid needs. MOB was receptive and expressed appreciation. CSW informed MOB about the hospital drug screen policy. MOB made aware that the infant's UDS resulted positive for Amphetamines. CSW inquired about MOB illicit substance use during the pregnancy. MOB stated that she did not know that she was pregnant and  "went out partying with friends." MOB reported she used Adderall, Xanax, "smoked weed" and had one pack of cigarettes per day. MOB stated, " Ain't no telling what I was using, I was being dumb and partying." MOB reported that she "smoked weed" a week ago and used Xanax and Adderall a couple months ago. CSW informed MOB that a report will be filed with [facility] CPS. CSW inquired about MOB CPS history. MOB stated that she had "several calls made over the years." The last case was in 2020 with [place]. She had a child that was born with "drugs in his system." She gave the child up for adoption. CSW inquired about MOB other children and if she has custody. MOB reported her daughter is 73 and she has a 57 year old son that lives with his father in [place]. She shares joint custody with his father. CSW asked if MOB had additional questions regarding the process. MOB had no additional questions. CSW discussed MOB SA use and offered inpatient and outpatient resources for treatment. MOB politely declined and stated she is not interested and feels she can quit on her own.   CSW inquired if MOB has history of mental health. MOB denied any mental health concerns. CSW discussed PPD. MOB shared that she had PPD in 2020 after giving her last child up for adoption. MOB expressed it was hard on her emotionally and just pushed through it. CSW validated MOB emotions. CSW provided education regarding the baby blues period vs. perinatal mood disorders, discussed treatment and gave resources for mental health follow up if concerns arise.  CSW recommended MOB complete a self-evaluation during the postpartum time period using the New Mom Checklist from Postpartum Progress and encouraged MOB to contact a medical professional if symptoms are noted at any time. CSW assessed MOB for safety. MOB denied thoughts of harm to self and others. MOB denied domestic violence concerns.   CSW inquired if MOB has items to care for  the infant. MOB reported she has all essential items to care for the infant including a bassinet for the infant to sleep. CSW provided review of Sudden Infant Death Syndrome (SIDS) precautions. MOB reported she is still deciding on a pediatrician for the infant. MOB notified that CSW will make referrals to [address] [facility] and [facility]. CSW assessed MOB for additional needs. MOB reported no further need.   CSW notified [facility] financial Counselor about MOB Medicaid needs. The FC to follow up with MOB today.   Infant's UDS resulted positive for Amphetamines. CSW made a report to [place]. CPS intake notified CSW that CPS will complete [facility] referral.   CSW notified the case was accepted and assigned to [place] social worker Jana Half [phone], [place] Supervisor Eulas Post  [phone].   Barriers to infant's discharge.   CSW Plan/Description:  CSW Will Continue to Monitor Umbilical Cord Tissue Drug Screen Results and Make Report if Warranted, Sudden Infant Death Syndrome (SIDS) Education, Other Information/Referral to [employer], CSW Awaiting CPS Disposition Plan, Child Protective Service Report  , [place], Perinatal Mood and Anxiety Disorder (PMADs) Education      Lia Hopping, LCSW 08/11/2022, 11:58 AM

## 2022-08-11 NOTE — Social Work (Signed)
Per [employer]HS intake Pam Miller, the current assigned CPS social worker for the case is Kaitlyn Allen [phone] and CPS supervisor Tiffani Lykes [phone]. The case accepted as a 24 hour. CPS social worker Kaitlyn reported she has been trying to reach out to MOB. CSW provided MOB hospital room phone number.   Barriers to infant's discharge. CSW will continue to follow for ongoing needs.   Liam Cammarata, MSW, LCSW [facility]  Clinical Social Worker  [phone] 08/11/2022  3:55 PM  

## 2022-08-11 NOTE — Progress Notes (Signed)
POSTPARTUM PROGRESS NOTE  POD #1  Subjective:  Lacey Ford is a 36 y.o. [id number] s/p pLTCS at [redacted]w[redacted]d No acute events overnight. She reports she is doing well. She denies any problems with ambulating, voiding or po intake. Denies nausea or vomiting. She has not passed flatus. Pain is well controlled.  Lochia is adequate.  Objective: Blood pressure 105/70, pulse 63, temperature 97.6 F (36.4 C), temperature source Oral, resp. rate 16, height _0  (1.651 m), weight 72.2 kg, SpO2 100 %, unknown if currently breastfeeding.  Physical Exam:  General: alert, cooperative and no distress Chest: no respiratory distress Heart: regular rate, distal pulses intact Uterine Fundus: firm, appropriately tender DVT Evaluation: No calf swelling or tenderness Extremities: mild edema Skin: warm, dry; incision clean/dry/intact w/ honeycomb dressing in place  Recent Labs    08/10/22 1529 08/11/22 0554  HGB 9.7* 8.0*  HCT 29.9* 25.0*    Assessment/Plan: Lacey Ford a 36y.o. GY1E5631s/p pLTCS at 360w1dor low lying placenta (1cm from os).  POD#1 - Doing welll; pain well controlled. H/H appropriate  Routine postpartum care  OOB, ambulated  Lovenox for VTE prophylaxis Anemia: asymptomatic  IV Venofer x1 Rubella non-immune: needs MMR  Contraception: unsure Feeding: Both  Dispo: Plan for discharge in the next 24-48 hours.   LOS: 1 day   JeShelda PalDO OB Fellow  08/11/2022, 7:18 AM

## 2022-08-11 NOTE — Anesthesia Postprocedure Evaluation (Signed)
Anesthesia Post Note  Patient: Lacey Ford  Procedure(s) Performed: CESAREAN SECTION     Patient location during evaluation: Mother Baby Anesthesia Type: Spinal Level of consciousness: oriented and awake and alert Pain management: pain level controlled Vital Signs Assessment: post-procedure vital signs reviewed and stable Respiratory status: spontaneous breathing and respiratory function stable Cardiovascular status: blood pressure returned to baseline and stable Postop Assessment: no headache, no backache, no apparent nausea or vomiting and able to ambulate Anesthetic complications: no   No notable events documented.  Last Vitals:  Vitals:   08/10/22 2345 08/11/22 0045  BP: 123/82 113/76  Pulse: 78 74  Resp: 18 19  Temp:    SpO2: 100% 98%    Last Pain:  Vitals:   08/11/22 0045  TempSrc:   PainSc: 0-No pain   Pain Goal:                Epidural/Spinal Function Cutaneous sensation: Normal sensation (08/11/22 0045), Patient able to flex knees: Yes (08/11/22 0045), Patient able to lift hips off bed: Yes (08/11/22 0045), Back pain beyond tenderness at insertion site: No (08/11/22 0045), Progressively worsening motor and/or sensory loss: No (08/11/22 0045), Bowel and/or bladder incontinence post epidural: No (08/11/22 0045)  Trevor Iha

## 2022-08-12 LAB — RPR: RPR Ser Ql: NONREACTIVE

## 2022-08-12 NOTE — Progress Notes (Signed)
POSTPARTUM PROGRESS NOTE  POD #2  Subjective:  Lacey Ford is a 36 y.o. F7T0240 s/p pLTCS at [redacted]w[redacted]d No acute events overnight. She reports she is doing well. She denies any problems with ambulating, voiding or po intake. Denies nausea or vomiting. She has not passed flatus. Pain is well controlled.  Lochia is adequate.  Objective: Blood pressure (!) 114/59, pulse 82, temperature 98.4 F (36.9 C), temperature source Oral, resp. rate 18, height _0  (1.651 m), weight 72.2 kg, SpO2 100 %, unknown if currently breastfeeding.  Physical Exam:  General: alert, cooperative and no distress Chest: no respiratory distress Heart: regular rate, distal pulses intact Uterine Fundus: firm, appropriately tender DVT Evaluation: No calf swelling or tenderness Extremities: mild edema Skin: warm, dry; incision clean/dry/intact w/ honeycomb dressing in place  Recent Labs    08/10/22 1529 08/11/22 0554  HGB 9.7* 8.0*  HCT 29.9* 25.0*    Assessment/Plan: Lacey Ford a 36y.o. GX7D5329s/p pLTCS at 326w1dor low lying placenta (1cm from os).  POD#1 - Doing welll; pain well controlled. H/H appropriate  Routine postpartum care  OOB, ambulated  Lovenox for VTE prophylaxis Anemia: asymptomatic  IV Venofer x1 Rubella non-immune: needs MMR  Contraception: unsure; had depo and nexplanon which increased her bleeding.  Feeding: Both  Dispo: Plan for discharge 9/16.    LOS: 2 days   Lacey Ford OB Fellow  08/12/2022, 6:20 AM

## 2022-08-12 NOTE — Social Work (Signed)
CSW escorted [facility] CPS social worker Marily Memos [phone] to [facility] to complete assessment.   Vivi Barrack, MSW, LCSW [facility]  Clinical Social Worker  [phone] 08/12/2022  8:42 AM

## 2022-08-13 MED ORDER — IBUPROFEN 600 MG PO TABS: 600.0000 mg | ORAL_TABLET | Freq: Four times a day (QID) | ORAL | 0 refills | Status: DC

## 2022-08-13 MED ORDER — OXYCODONE HCL 5 MG PO TABS: 5.0000 mg | ORAL_TABLET | ORAL | 0 refills | Status: DC | PRN

## 2022-08-13 MED ORDER — MEASLES, MUMPS & RUBELLA VAC IJ SOLR: 0.5000 mL | Freq: Once | INTRAMUSCULAR | Status: DC

## 2022-08-13 NOTE — Plan of Care (Signed)
Discharge instructions given with after visit summary, pt receptive.  

## 2022-08-14 LAB — TYPE AND SCREEN
ABO/RH(D): A POS
Antibody Screen: NEGATIVE
Unit division: 0
Unit division: 0
Unit division: 0
Unit division: 0

## 2022-08-14 LAB — BPAM RBC
Blood Product Expiration Date: 202310122359
Blood Product Expiration Date: 202310122359
Blood Product Expiration Date: 202310122359
Blood Product Expiration Date: 202310122359
ISSUE DATE / TIME: 202309141913
ISSUE DATE / TIME: 202309142049
Unit Type and Rh: 6200
Unit Type and Rh: 6200
Unit Type and Rh: 6200
Unit Type and Rh: 6200

## 2022-08-17 ENCOUNTER — Encounter: Payer: Self-pay | Admitting: Family Medicine

## 2022-08-18 ENCOUNTER — Telehealth (HOSPITAL_COMMUNITY): Payer: Self-pay

## 2022-08-18 ENCOUNTER — Telehealth: Payer: Self-pay | Admitting: *Deleted

## 2022-08-18 ENCOUNTER — Encounter: Payer: Self-pay | Admitting: *Deleted

## 2022-08-18 ENCOUNTER — Ambulatory Visit: Payer: Self-pay

## 2022-08-18 NOTE — Telephone Encounter (Signed)
Patient [facility] nurse visit for BP check. I called her number twice and heard " call cannot be completed". Patient does not have MyChart. Will send letter.  Staci Acosta

## 2022-08-18 NOTE — Telephone Encounter (Addendum)
No answer. No voicemail option available. Says call cannot be completed as dialed.   Sharyn [facility]  08/18/22,1538

## 2022-08-24 ENCOUNTER — Encounter: Payer: Self-pay | Admitting: Family Medicine

## 2022-08-25 ENCOUNTER — Other Ambulatory Visit: Payer: Self-pay

## 2022-09-21 NOTE — Progress Notes (Incomplete)
Pp pap smear needed 1 visit at 10 weeks, diagnosed with gHTN and had a known previa, delivered by c-section  Declined BTL Needs rubella

## 2022-09-22 ENCOUNTER — Ambulatory Visit: Payer: Self-pay | Admitting: Obstetrics and Gynecology

## 2023-10-19 ENCOUNTER — Other Ambulatory Visit (HOSPITAL_COMMUNITY): Payer: Self-pay

## 2023-11-29 NOTE — L&D Delivery Note (Signed)
 OB/GYN Faculty Practice Delivery Note  Lacey Ford is a 38 y.o. V4U9811 s/p VBAC at [redacted]w[redacted]d. She was admitted for spontaneous labor.   ROM: 2h 92m with clear fluid GBS Status: negative   Maximum Maternal Temperature: 98.3 F    Labor Progress: Patient arrived at 8 cm dilation and was induced with AROM. She was then managed expectantly and reached 10 cm.   Delivery Date/Time: 05/15/2024 at 1207 Delivery: Called to room and patient was complete and pushing. Head delivered in ROA position. No nuchal cord present, but there was a true knot close to umbilicus. Shoulder and body delivered in usual fashion. Infant with spontaneous cry, placed on mother's abdomen, dried and stimulated. Cord clamped x 2 after 1-minute delay, and cut by PA student! Cord blood drawn. Placenta delivered spontaneously with gentle cord traction. Fundus firm with massage and Pitocin . Labia, perineum, vagina, and cervix inspected with bilateral periuretheral lacerations, L side repaired in usual fashion with 4-0 vicryl.   After delivery of the placenta bleeding was brisk and initially resolved with fundal rub. However there was persistent bleeding about once a minute without constant fundal rub. A dose of methergine  was given with no appreciable improvement in tone, at which point I asked for TXA to be given and called for a JADA and 800 mcg misoprostol . I gave the misoprostol  while the JADA was being brought to the room and then placed the JADA. Total EBL at time I left the room was 1054 cc.  Placenta: 3v intact, to L&D Complications: post-partum hemorrhage Lacerations: bilateral periurethral, L side repaired  EBL: 1054 cc Analgesia: epidural   Infant: Baby girl [name]  APGAR (1 MIN): 9  APGAR (5 MINS): 9   Weight: 3340 grams  Teena Feast, MD/MPH Attending Family Medicine Physician, [facility] for [facility], [facility]

## 2024-05-04 ENCOUNTER — Encounter (HOSPITAL_COMMUNITY): Payer: Self-pay

## 2024-05-04 ENCOUNTER — Inpatient Hospital Stay (HOSPITAL_COMMUNITY): Payer: Self-pay

## 2024-05-04 ENCOUNTER — Inpatient Hospital Stay (HOSPITAL_COMMUNITY)
Admission: AD | Admit: 2024-05-04 | Discharge: 2024-05-04 | Disposition: A | Discharge status: Home / Self Care | Payer: Self-pay | Attending: Obstetrics & Gynecology | Admitting: Obstetrics & Gynecology

## 2024-05-04 DIAGNOSIS — Z3A35 35 weeks gestation of pregnancy: Secondary | ICD-10-CM

## 2024-05-04 DIAGNOSIS — F17210 Nicotine dependence, cigarettes, uncomplicated: Secondary | ICD-10-CM | POA: Insufficient documentation

## 2024-05-04 DIAGNOSIS — O09523 Supervision of elderly multigravida, third trimester: Secondary | ICD-10-CM | POA: Insufficient documentation

## 2024-05-04 DIAGNOSIS — O26893 Other specified pregnancy related conditions, third trimester: Secondary | ICD-10-CM

## 2024-05-04 DIAGNOSIS — O0933 Supervision of pregnancy with insufficient antenatal care, third trimester: Secondary | ICD-10-CM | POA: Insufficient documentation

## 2024-05-04 DIAGNOSIS — O99891 Other specified diseases and conditions complicating pregnancy: Secondary | ICD-10-CM

## 2024-05-04 DIAGNOSIS — O99323 Drug use complicating pregnancy, third trimester: Secondary | ICD-10-CM | POA: Insufficient documentation

## 2024-05-04 DIAGNOSIS — Z3A22 22 weeks gestation of pregnancy: Secondary | ICD-10-CM

## 2024-05-04 DIAGNOSIS — M7918 Myalgia, other site: Secondary | ICD-10-CM

## 2024-05-04 DIAGNOSIS — O99333 Smoking (tobacco) complicating pregnancy, third trimester: Secondary | ICD-10-CM | POA: Insufficient documentation

## 2024-05-04 DIAGNOSIS — R102 Pelvic and perineal pain: Secondary | ICD-10-CM | POA: Insufficient documentation

## 2024-05-04 LAB — FETAL FIBRONECTIN: Fetal Fibronectin: NEGATIVE

## 2024-05-04 LAB — CBC
HCT: 29.6 % — ABNORMAL LOW (ref 36.0–46.0)
Hemoglobin: 9.8 g/dL — ABNORMAL LOW (ref 12.0–15.0)
MCH: 27.3 pg (ref 26.0–34.0)
MCHC: 33.1 g/dL (ref 30.0–36.0)
MCV: 82.5 fL (ref 80.0–100.0)
Platelets: 183 10*3/uL (ref 150–400)
RBC: 3.59 MIL/uL — ABNORMAL LOW (ref 3.87–5.11)
RDW: 15.4 % (ref 11.5–15.5)
WBC: 9.7 10*3/uL (ref 4.0–10.5)
nRBC: 0 % (ref 0.0–0.2)

## 2024-05-04 LAB — DIFFERENTIAL
Abs Immature Granulocytes: 0.06 10*3/uL (ref 0.00–0.07)
Basophils Absolute: 0 10*3/uL (ref 0.0–0.1)
Basophils Relative: 0 %
Eosinophils Absolute: 0.1 10*3/uL (ref 0.0–0.5)
Eosinophils Relative: 1 %
Immature Granulocytes: 1 %
Lymphocytes Relative: 16 %
Lymphs Abs: 1.6 10*3/uL (ref 0.7–4.0)
Monocytes Absolute: 0.9 10*3/uL (ref 0.1–1.0)
Monocytes Relative: 9 %
Neutro Abs: 7 10*3/uL (ref 1.7–7.7)
Neutrophils Relative %: 73 %

## 2024-05-04 LAB — COMPREHENSIVE METABOLIC PANEL WITH GFR
ALT: 14 U/L (ref 0–44)
AST: 20 U/L (ref 15–41)
Albumin: 2.6 g/dL — ABNORMAL LOW (ref 3.5–5.0)
Alkaline Phosphatase: 95 U/L (ref 38–126)
Anion gap: 11 (ref 5–15)
BUN: 5 mg/dL — ABNORMAL LOW (ref 6–20)
CO2: 20 mmol/L — ABNORMAL LOW (ref 22–32)
Calcium: 8.9 mg/dL (ref 8.9–10.3)
Chloride: 103 mmol/L (ref 98–111)
Creatinine, Ser: 0.55 mg/dL (ref 0.44–1.00)
GFR, Estimated: >60 mL/min (ref >60)
Glucose, Bld: 99 mg/dL (ref 70–99)
Potassium: 3.4 mmol/L — ABNORMAL LOW (ref 3.5–5.1)
Sodium: 134 mmol/L — ABNORMAL LOW (ref 135–145)
Total Bilirubin: 0.3 mg/dL (ref 0.0–1.2)
Total Protein: 6 g/dL — ABNORMAL LOW (ref 6.5–8.1)

## 2024-05-04 LAB — TYPE AND SCREEN
ABO/RH(D): A POS
Antibody Screen: NEGATIVE

## 2024-05-04 LAB — HEPATITIS B SURFACE ANTIGEN: Hepatitis B Surface Ag: NONREACTIVE

## 2024-05-04 LAB — URINALYSIS, ROUTINE W REFLEX MICROSCOPIC
Bacteria, UA: NONE SEEN
Bilirubin Urine: NEGATIVE
Glucose, UA: NEGATIVE mg/dL
Hgb urine dipstick: NEGATIVE
Ketones, ur: NEGATIVE mg/dL
Nitrite: NEGATIVE
Protein, ur: NEGATIVE mg/dL
Specific Gravity, Urine: 1.009 (ref 1.005–1.030)
pH: 7 (ref 5.0–8.0)

## 2024-05-04 LAB — HEPATITIS C ANTIBODY: HCV Ab: NONREACTIVE

## 2024-05-04 LAB — LIPASE, BLOOD: Lipase: 37 U/L (ref 11–51)

## 2024-05-04 LAB — WET PREP, GENITAL
Clue Cells Wet Prep HPF POC: NONE SEEN
Sperm: NONE SEEN
Trich, Wet Prep: NONE SEEN
WBC, Wet Prep HPF POC: <10 (ref <10)
Yeast Wet Prep HPF POC: NONE SEEN

## 2024-05-04 LAB — POCT PREGNANCY, URINE: Preg Test, Ur: POSITIVE — AB

## 2024-05-04 LAB — HIV ANTIBODY (ROUTINE TESTING W REFLEX): HIV Screen 4th Generation wRfx: NONREACTIVE

## 2024-05-04 MED ORDER — OXYCODONE-ACETAMINOPHEN 5-325 MG PO TABS: 1.0000 | ORAL_TABLET | Freq: Four times a day (QID) | ORAL | 0 refills | Status: DC | PRN

## 2024-05-04 MED ORDER — CYCLOBENZAPRINE HCL 10 MG PO TABS: 10.0000 mg | ORAL_TABLET | Freq: Two times a day (BID) | ORAL | 0 refills | Status: AC | PRN

## 2024-05-04 MED ORDER — CYCLOBENZAPRINE HCL 5 MG PO TABS
10.0000 mg | ORAL_TABLET | Freq: Once | ORAL | Status: AC
  Administered 2024-05-04: 10 mg via ORAL
  Filled 2024-05-04: qty 2

## 2024-05-04 MED ORDER — MORPHINE SULFATE (PF) 4 MG/ML IV SOLN: 4.0000 mg | Freq: Once | INTRAVENOUS | Status: DC

## 2024-05-04 MED ORDER — IBUPROFEN 600 MG PO TABS
600.0000 mg | ORAL_TABLET | Freq: Four times a day (QID) | ORAL | 0 refills | Status: DC | PRN
Start: 2024-05-04 — End: 2024-05-04

## 2024-05-04 MED ORDER — ACETAMINOPHEN 500 MG PO TABS
1000.0000 mg | ORAL_TABLET | Freq: Once | ORAL | Status: AC
  Administered 2024-05-04: 1000 mg via ORAL
  Filled 2024-05-04: qty 2

## 2024-05-04 NOTE — Discharge Instructions (Signed)
Source: [facility]  Seven Things to Know About the Pain from Pubic Symphysis By: Jeananne Rama, PT, DPT     Today we welcome guest writer, Jeananne Rama, PT,  DPT, a physical therapist specializing in pelvic floor health for the childbearing year.  I am grateful that the topic of pelvic floor health during pregnancy and postpartum is being discussed, as many people suffer in silence and without helpful resources. Greggory Stallion,  Medical sales representative, Connecting the Dots  Introduction Joint pain is a very common physical challenge resulting from pregnancy. In particular, pelvic joint pain affects 16 to 25% of pregnancies, with onset anywhere from the first to third trimester (Kanakaris, 2011). This article will give you answers to the seven most common questions childbirth educators get about pain in the pubic symphysis--the joint at the very front of the pelvis that expands as pregnancy progresses.    1. What does pubic symphysis pain feel like? Sometimes pubic symphysis pain can feel like a slight pinch or ache. Other times it hurts so much someone will not want to walk. In certain cases, the pain will not be over the pubic symphysis, but in the creases of the groins or along the inner thighs. Occasionally, pain is only felt on one side of the body. Pubic symphysis joint pain is commonly provoked by moving the legs apart, such as getting in and out of a car, climbing out of bed, rolling in bed, or going up and down stairs. Standing up from prolonged sitting, particularly on a soft couch, is another known trigger.  2. Do I need to tell my midwife or doctor about my pain? Yes! Any time you have notable pain, you should be directed to a knowledgeable medical provider for diagnosis. The medical term for problematic pubic symphysis pain is symphysis pubis dysfunction (SPD).  3. Is pubic symphysis pain treatable? Yes! There are a number of at-home treatment options including rest, bracing, and following  self-help tips. Many people with pubic symphysis joint pain in pregnancy also benefit from physical therapy, massage therapy, acupuncture, or chiropractic care. Here's a list of self-help tips:   Walking: Walk with shorter steps to decrease pubic symphysis irritation. When carrying items, wear them in a backpack or hold them close to the chest to avoid uneven loading of the pelvis causing pain.    Sleep: Keep a pillow between the legs that spans from the knees to the ankles (not just between the knees).   Rolling over in bed: To go from one side to the other, (1) bend knees to 90 degrees, (2) tense the belly gently for stability (sometimes doing a Kegel helps too!), and (3) roll with knees, hips, and shoulders in line like a log. Avoid flopping side to side in bed or letting the spine twist.   Dressing: Sit down on the edge of the bed or on a chair to get dressed. Standing on one leg can irritate the pubic symphysis.   Stairs: Face the railing and try going up stairs stepping to the side. Alternatively, take the steps one at a time to avoid a large movement with the pelvis.   Car: When getting in a car, turn their back to the seat and aim their bottom in. Once seated, keep the knees close together as they slowly turn to face forward. Getting out is the same process in reverse. Keeping the knees close together helps prevent the pubic symphysis from feeling pulled on.   Sitting: Rest as evenly as  possible on their sit bones. Consider putting a pillow behind the back for support. Avoid slumping or leaning on one sit bone more than the other. Sitting on a large exercise ball (about 65 cm) may offer relief of pain. Avoid sitting cross legged on the floor.   Sexual activities: Aim to keep the knees together as much as possible. This may require some creativity for positioning (think spooning or using a chair).   4. Are support belts a good idea?  Most, but not all, people with SPD feel less pain and  have an easier time walking while wearing a support belt. One of the most common support belts for SPD treatment is the Serola Sacroiliac Belt. Another common support belt is the Upsie Belly. Many support belts can be purchased using a Health Savings Account (HSA) or Flexible Health Savings (FSA). Some people use a support belt just when they need more support, such as for a walk around the neighborhood. Other people wear the belt 23 hours per day, just taking it off to shower. Many people take them off for sitting. Sometimes people find that they need help from a physical therapist or chiropractor to adjust the pubic bones before splinting them with a brace.   5. What should I expect during birth? It is safe to labor with pain in the pubic symphysis joints, and this pain does not change the physiological process of birth. However, many birthing people find it helpful to adjust their positioning in labor and delivery to manage discomfort in the pubic symphysis joint. While going through contractions, positions that take weight off the pelvis may feel better. These positions include resting the upper body on an exercise ball, slow dance, being in a tub or pool, and sidelying. During pushing, hands and knees and sidelying are frequently more comfortable positions. Common positions to avoid if possible include one or both knees to chest, lunging, and climbing stairs.   6. What should I expect after I give birth?  Right after birth, the pubic symphysis pain may suddenly go away or it may feel worse from the physical rigors of labor. The first few days postpartum are often the most challenging, and a walker or wheelchair may be needed temporarily. Wearing a support belt is often helpful. If SPD is treated, most people get back to an active lifestyle by three to four months postpartum.   7. What about a future pregnancy? Joint pain that occurs during one pregnancy is worse during a subsequent pregnancy, unless  the underlying risk factors are addressed (Kanakaris, 2011). Early use of interventions to manage pain, such as a support belt or targeted exercise, can lead to a more satisfying pregnancy experience overall. On occasion, a person who experienced a traumatic pubic symphysis injury during birth will choose an elective cesarean for the following birth. A cesarean is not required in these cases, but it can help the birthing person to feel more in control and to avoid a triggering experience.     Resources Websites   Baby Centre (https://www.babycentre.co.uk/a546492/pelvic-pain-in-pregnancy-spd)  [place]'s National Health Service (https://www.nhs.uk/pregnancy/related-conditions/common-symptoms/pelvic-pain/)  At [facility], we have pelvic floor/female physical therapy specialists available. Ask your midwife or doctor.    Or here are other sites to search for therapists:   American Physical Therapy Associations (https://ptl.womenshealthapta.org).   Global Pelvic Health Alliance (https://pelvicguru.com)  Kanakaris, N. K., Roberts, C. S., & Giannoudis, P. V. (2011). Pregnancy-related pelvic girdle pain: an update. BMC Medicine, 9(15). https://doi.org/10.1186/1741-7014-08-12  

## 2024-05-04 NOTE — MAU Provider Note (Cosign Needed Addendum)
 History     CSN: [id number]  Arrival date and time: 05/04/24 1216   None     Chief Complaint  Patient presents with   Abdominal Pain    Pressure, constant   Lacey Ford is a 38 y.o. year old [phone] female at approximately [redacted]w[redacted]d weeks gestation who presents to MAU reporting lower abdominal pressure that started this morning and is now having concurrent low back pain. The pressure is constant and has not been alleviated by position changes or walking. She has not tried taking any medication for pain relief. She self reports history of cocaine and methamphetamine use prior to finding out that she was pregnant. She denies any recent use and consents to UDS today. She denies any vaginal bleeding or leaking of fluid and reports positive fetal movement.     Abdominal Pain    OB History     Gravida  8   Para  4   Term  3   Preterm  1   AB  3   Living  4      SAB  2   IAB  1   Ectopic      Multiple  0   Live Births  4           Past Medical History:  Diagnosis Date   Preeclampsia 05/30/2019    Past Surgical History:  Procedure Laterality Date   CESAREAN SECTION N/A 08/10/2022   Procedure: CESAREAN SECTION;  Surgeon: Abigail Abler, MD;  Location: [facility];  Service: Obstetrics;  Laterality: N/A;    Family History  Problem Relation Age of Onset   Epilepsy Mother     Social History   Tobacco Use   Smoking status: Every Day    Current packs/day: 0.50    Average packs/day: 0.5 packs/day for 19.4 years (9.7 ttl pk-yrs)    Types: Cigarettes    Start date: 2006   Smokeless tobacco: Never  Substance Use Topics   Alcohol use: Not Currently    Comment: Occasional   Drug use: Yes    Types: Marijuana, Cocaine, Methamphetamines    Comment: "smoked a couple blunts"; has used cocaine and meth in the past but not since she found out she was pregnant    Allergies: No Known Allergies  No medications prior to admission.    Review of Systems   Gastrointestinal:  Positive for abdominal pain.  Musculoskeletal:  Positive for back pain.  All other systems reviewed and are negative.  Physical Exam   Blood pressure 133/80, pulse 89, temperature (!) 97.4 F (36.3 C), temperature source Oral, resp. rate 18, SpO2 99%, unknown if currently breastfeeding.  Physical Exam Vitals reviewed. Exam conducted with a chaperone present.  HENT:     Head: Normocephalic.  Cardiovascular:     Rate and Rhythm: Normal rate.  Pulmonary:     Effort: Pulmonary effort is normal.  Abdominal:     Palpations: Abdomen is soft.     Comments: Fundal height 33cm  Genitourinary:    General: Normal vulva.  Skin:    General: Skin is warm and dry.  Neurological:     Mental Status: She is alert and oriented to person, place, and time.  Psychiatric:        Mood and Affect: Mood is anxious.        Behavior: Behavior is cooperative.   Dilation: 1 Effacement (%): Thick Station: Ballotable Presentation: Undeterminable    MAU Course  Procedures  MDM  REACTIVE NST - FHR: 130 bpm / moderate variability / accels present / decels absent / TOCO: mild irregular every 2-5 mins  Results for orders placed or performed during the hospital encounter of 05/04/24 (from the past 24 hours)  Pregnancy, urine POC     Status: Abnormal   Collection Time: 05/04/24 12:42 PM  Result Value Ref Range   Preg Test, Ur POSITIVE (A) NEGATIVE  Urinalysis, Routine w reflex microscopic -Urine, Clean Catch     Status: Abnormal   Collection Time: 05/04/24 12:44 PM  Result Value Ref Range   Color, Urine YELLOW YELLOW   APPearance HAZY (A) CLEAR   Specific Gravity, Urine 1.009 1.005 - 1.030   pH 7.0 5.0 - 8.0   Glucose, UA NEGATIVE NEGATIVE mg/dL   Hgb urine dipstick NEGATIVE NEGATIVE   Bilirubin Urine NEGATIVE NEGATIVE   Ketones, ur NEGATIVE NEGATIVE mg/dL   Protein, ur NEGATIVE NEGATIVE mg/dL   Nitrite NEGATIVE NEGATIVE   Leukocytes,Ua TRACE (A) NEGATIVE   RBC / HPF 6-10  0 - 5 RBC/hpf   WBC, UA 0-5 0 - 5 WBC/hpf   Bacteria, UA NONE SEEN NONE SEEN   Squamous Epithelial / HPF 0-5 0 - 5 /HPF   Mucus PRESENT   Fetal fibronectin     Status: None   Collection Time: 05/04/24  1:54 PM  Result Value Ref Range   Fetal Fibronectin NEGATIVE NEGATIVE  Wet prep, genital     Status: None   Collection Time: 05/04/24  1:56 PM   Specimen: PATH Cytology Cervicovaginal Ancillary Only  Result Value Ref Range   Yeast Wet Prep HPF POC NONE SEEN NONE SEEN   Trich, Wet Prep NONE SEEN NONE SEEN   Clue Cells Wet Prep HPF POC NONE SEEN NONE SEEN   WBC, Wet Prep HPF POC <10 <10   Sperm NONE SEEN   Type and screen     Status: None   Collection Time: 05/04/24  1:58 PM  Result Value Ref Range   ABO/RH(D) A POS    Antibody Screen NEG    Sample Expiration      05/07/2024,2359 Performed at [facility], [address], [place], [place] [address]   Hepatitis B surface antigen     Status: None   Collection Time: 05/04/24  2:02 PM  Result Value Ref Range   Hepatitis B Surface Ag NON REACTIVE NON REACTIVE  CBC     Status: Abnormal   Collection Time: 05/04/24  2:02 PM  Result Value Ref Range   WBC 9.7 4.0 - 10.5 K/uL   RBC 3.59 (L) 3.87 - 5.11 MIL/uL   Hemoglobin 9.8 (L) 12.0 - 15.0 g/dL   HCT 62.1 (L) 30.8 - 65.7 %   MCV 82.5 80.0 - 100.0 fL   MCH 27.3 26.0 - 34.0 pg   MCHC 33.1 30.0 - 36.0 g/dL   RDW 84.6 96.2 - 95.2 %   Platelets 183 150 - 400 K/uL   nRBC 0.0 0.0 - 0.2 %  Differential     Status: None   Collection Time: 05/04/24  2:02 PM  Result Value Ref Range   Neutrophils Relative % 73 %   Neutro Abs 7.0 1.7 - 7.7 K/uL   Lymphocytes Relative 16 %   Lymphs Abs 1.6 0.7 - 4.0 K/uL   Monocytes Relative 9 %   Monocytes Absolute 0.9 0.1 - 1.0 K/uL   Eosinophils Relative 1 %   Eosinophils Absolute 0.1 0.0 - 0.5 K/uL  Basophils Relative 0 %   Basophils Absolute 0.0 0.0 - 0.1 K/uL   Immature Granulocytes 1 %   Abs Immature Granulocytes 0.06 0.00 - 0.07 K/uL   HIV Antibody (routine testing w rflx)     Status: None   Collection Time: 05/04/24  2:02 PM  Result Value Ref Range   HIV Screen 4th Generation wRfx Non Reactive Non Reactive  Comprehensive metabolic panel     Status: Abnormal   Collection Time: 05/04/24  2:02 PM  Result Value Ref Range   Sodium 134 (L) 135 - 145 mmol/L   Potassium 3.4 (L) 3.5 - 5.1 mmol/L   Chloride 103 98 - 111 mmol/L   CO2 20 (L) 22 - 32 mmol/L   Glucose, Bld 99 70 - 99 mg/dL   BUN 5 (L) 6 - 20 mg/dL   Creatinine, Ser 1.91 0.44 - 1.00 mg/dL   Calcium  8.9 8.9 - 10.3 mg/dL   Total Protein 6.0 (L) 6.5 - 8.1 g/dL   Albumin 2.6 (L) 3.5 - 5.0 g/dL   AST 20 15 - 41 U/L   ALT 14 0 - 44 U/L   Alkaline Phosphatase 95 38 - 126 U/L   Total Bilirubin 0.3 0.0 - 1.2 mg/dL   GFR, Estimated >47 >82 mL/min   Anion gap 11 5 - 15  Lipase, blood     Status: None   Collection Time: 05/04/24  2:02 PM  Result Value Ref Range   Lipase 37 11 - 51 U/L  Hepatitis C antibody     Status: None   Collection Time: 05/04/24  2:02 PM  Result Value Ref Range   HCV Ab NON REACTIVE NON REACTIVE   Tylenol  1000 mg PO and Flexeril 10 mg PO given. Patient reports pain improvement.  Assessment and Plan   1. Late prenatal care affecting pregnancy in third trimester (Primary) 2. Pelvic pain affecting pregnancy in third trimester, antepartum - Labs and exam indicate no PTL. No acute abdomen. - Cat 1 FHT - Discussed using an abdominal binder to help support her pelvis and round ligaments. - Improved symptoms with Flexeril. Rx sent. - Return precautions for pregnancy related emergencies including vaginal bleeding, LOF, painful contractions, and DFM. - Message to be sent to [facility] for initial OB appointment.   Allergies as of 05/04/2024   No Known Allergies      Medication List     STOP taking these medications    ibuprofen  600 MG tablet Commonly known as: ADVIL    oxyCODONE  5 MG immediate release tablet Commonly known as: Oxy  IR/ROXICODONE    Prenatal 27-1 MG Tabs       TAKE these medications    acetaminophen  325 MG tablet Commonly known as: TYLENOL  Take 2 tablets (650 mg total) by mouth every 4 (four) hours as needed for fever (for pain scale < 4  OR  temperature  >/=  100.5 F).   cyclobenzaprine 10 MG tablet Commonly known as: FLEXERIL Take 1 tablet (10 mg total) by mouth 2 (two) times daily as needed for muscle spasms.   multivitamin Liqd Take 5 mLs by mouth daily.        Wilford Hanks 05/04/2024, 6:17 PM   Midwife Attestation:  I personally saw and evaluated the patient, performing the key elements of the service. I developed and verified the management plan that is described in the resident's/student's note, and I agree with the content with my edits above. VSS, HRR&R, Resp unlabored, Legs neg.  Message sent  to [facility] to establish prenatal care.    Arlester Bence, CNM 9:17 PM

## 2024-05-04 NOTE — MAU Note (Incomplete)
..  Lacey Ford is a 38 y.o. at [redacted]w[redacted]d here in MAU reporting: increased abdominal pressure in the last few hours. Not painful, just intense pressure, constant. No contractions, no leaking fluid, normal fetal movement.   Has not had any prenatal care. Says she has tried to call multiple offices but has not been able to get an appointment due to an insurance issue.   History of c/s with last baby (G7) due to placenta previa. Prior to that had uncomplicated vaginal deliveries.   Nothing in vagina in last 48 hours.   LMP: 11/12/23 Onset of complaint: 0800 Pain score: no pain just pressure Vitals:   05/04/24 1255  BP: 130/69  Pulse: 96  Resp: 18  Temp: (!) 97.4 F (36.3 C)  SpO2: 100%     FHT:***  Lab orders placed from triage:   UA, urine preg test

## 2024-05-05 LAB — RPR: RPR Ser Ql: NONREACTIVE

## 2024-05-06 LAB — GC/CHLAMYDIA PROBE AMP (~~LOC~~) NOT AT ARMC
Chlamydia: NEGATIVE
Comment: NEGATIVE
Comment: NORMAL
Neisseria Gonorrhea: NEGATIVE

## 2024-05-06 LAB — CULTURE, BETA STREP (GROUP B ONLY)

## 2024-05-06 LAB — RUBELLA SCREEN: Rubella: 1.25 {index} (ref >0.99)

## 2024-05-15 ENCOUNTER — Encounter (HOSPITAL_COMMUNITY): Payer: Self-pay | Admitting: Obstetrics and Gynecology

## 2024-05-15 ENCOUNTER — Inpatient Hospital Stay (HOSPITAL_COMMUNITY): Payer: MEDICAID | Admitting: Anesthesiology

## 2024-05-15 ENCOUNTER — Inpatient Hospital Stay (HOSPITAL_COMMUNITY)
Admission: AD | Admit: 2024-05-15 | Discharge: 2024-05-17 | DRG: 768 | Disposition: A | Discharge status: Home / Self Care | Payer: MEDICAID | Attending: Obstetrics and Gynecology | Admitting: Obstetrics and Gynecology

## 2024-05-15 ENCOUNTER — Inpatient Hospital Stay (HOSPITAL_COMMUNITY): Payer: Self-pay | Admitting: Anesthesiology

## 2024-05-15 ENCOUNTER — Encounter: Payer: Self-pay | Admitting: Certified Nurse Midwife

## 2024-05-15 DIAGNOSIS — O4202 Full-term premature rupture of membranes, onset of labor within 24 hours of rupture: Secondary | ICD-10-CM

## 2024-05-15 DIAGNOSIS — F17210 Nicotine dependence, cigarettes, uncomplicated: Secondary | ICD-10-CM | POA: Diagnosis present

## 2024-05-15 DIAGNOSIS — O99320 Drug use complicating pregnancy, unspecified trimester: Secondary | ICD-10-CM | POA: Diagnosis present

## 2024-05-15 DIAGNOSIS — Z3009 Encounter for other general counseling and advice on contraception: Secondary | ICD-10-CM | POA: Diagnosis present

## 2024-05-15 DIAGNOSIS — O99334 Smoking (tobacco) complicating childbirth: Secondary | ICD-10-CM | POA: Diagnosis present

## 2024-05-15 DIAGNOSIS — O34219 Maternal care for unspecified type scar from previous cesarean delivery: Principal | ICD-10-CM | POA: Diagnosis not present

## 2024-05-15 DIAGNOSIS — Z98891 History of uterine scar from previous surgery: Secondary | ICD-10-CM

## 2024-05-15 DIAGNOSIS — O34211 Maternal care for low transverse scar from previous cesarean delivery: Secondary | ICD-10-CM

## 2024-05-15 DIAGNOSIS — Z3A36 36 weeks gestation of pregnancy: Secondary | ICD-10-CM

## 2024-05-15 DIAGNOSIS — F1590 Other stimulant use, unspecified, uncomplicated: Secondary | ICD-10-CM | POA: Diagnosis present

## 2024-05-15 DIAGNOSIS — Z8759 Personal history of other complications of pregnancy, childbirth and the puerperium: Secondary | ICD-10-CM

## 2024-05-15 DIAGNOSIS — O99324 Drug use complicating childbirth: Secondary | ICD-10-CM | POA: Diagnosis present

## 2024-05-15 DIAGNOSIS — O0933 Supervision of pregnancy with insufficient antenatal care, third trimester: Secondary | ICD-10-CM

## 2024-05-15 DIAGNOSIS — O9081 Anemia of the puerperium: Secondary | ICD-10-CM | POA: Diagnosis not present

## 2024-05-15 DIAGNOSIS — D62 Acute posthemorrhagic anemia: Secondary | ICD-10-CM | POA: Diagnosis not present

## 2024-05-15 LAB — CBC
HCT: 31.6 % — ABNORMAL LOW (ref 36.0–46.0)
HCT: 30.1 % — ABNORMAL LOW (ref 36.0–46.0)
Hemoglobin: 10 g/dL — ABNORMAL LOW (ref 12.0–15.0)
Hemoglobin: 9.5 g/dL — ABNORMAL LOW (ref 12.0–15.0)
MCH: 26.2 pg (ref 26.0–34.0)
MCH: 26.5 pg (ref 26.0–34.0)
MCHC: 31.6 g/dL (ref 30.0–36.0)
MCHC: 31.6 g/dL (ref 30.0–36.0)
MCV: 82.9 fL (ref 80.0–100.0)
MCV: 83.8 fL (ref 80.0–100.0)
Platelets: 197 10*3/uL (ref 150–400)
Platelets: 193 10*3/uL (ref 150–400)
RBC: 3.81 MIL/uL — ABNORMAL LOW (ref 3.87–5.11)
RBC: 3.59 MIL/uL — ABNORMAL LOW (ref 3.87–5.11)
RDW: 15.6 % — ABNORMAL HIGH (ref 11.5–15.5)
RDW: 15.3 % (ref 11.5–15.5)
WBC: 10.3 10*3/uL (ref 4.0–10.5)
WBC: 13.3 10*3/uL — ABNORMAL HIGH (ref 4.0–10.5)
nRBC: 0 % (ref 0.0–0.2)
nRBC: 0 % (ref 0.0–0.2)

## 2024-05-15 LAB — DIC (DISSEMINATED INTRAVASCULAR COAGULATION)PANEL
D-Dimer, Quant: 1.36 ug{FEU}/mL — ABNORMAL HIGH (ref 0.00–0.50)
Fibrinogen: 411 mg/dL (ref 210–475)
INR: 1 (ref 0.8–1.2)
Platelets: 198 10*3/uL (ref 150–400)
Prothrombin Time: 13.8 s (ref 11.4–15.2)
Smear Review: NONE SEEN
aPTT: 29 s (ref 24–36)

## 2024-05-15 LAB — RAPID URINE DRUG SCREEN, HOSP PERFORMED
Amphetamines: POSITIVE — AB
Barbiturates: NOT DETECTED
Benzodiazepines: NOT DETECTED
Cocaine: NOT DETECTED
Opiates: NOT DETECTED
Tetrahydrocannabinol: NOT DETECTED

## 2024-05-15 LAB — TYPE AND SCREEN
ABO/RH(D): A POS
Antibody Screen: NEGATIVE

## 2024-05-15 LAB — RPR: RPR Ser Ql: NONREACTIVE

## 2024-05-15 MED ORDER — MISOPROSTOL 200 MCG PO TABS
ORAL_TABLET | ORAL | Status: AC
  Filled 2024-05-15: qty 1

## 2024-05-15 MED ORDER — ACETAMINOPHEN 325 MG PO TABS: 650.0000 mg | ORAL_TABLET | ORAL | Status: DC | PRN

## 2024-05-15 MED ORDER — MAGNESIUM HYDROXIDE 400 MG/5ML PO SUSP: 30.0000 mL | ORAL | Status: DC | PRN

## 2024-05-15 MED ORDER — OXYCODONE HCL 5 MG PO TABS: 10.0000 mg | ORAL_TABLET | ORAL | Status: DC | PRN

## 2024-05-15 MED ORDER — SENNOSIDES-DOCUSATE SODIUM 8.6-50 MG PO TABS
2.0000 | ORAL_TABLET | ORAL | Status: DC
Start: 2024-05-15 — End: 2024-05-17
  Administered 2024-05-15 – 2024-05-16 (×2): 2 via ORAL
  Filled 2024-05-15 (×2): qty 2

## 2024-05-15 MED ORDER — EPHEDRINE 5 MG/ML INJ: 10.0000 mg | INTRAVENOUS | Status: DC | PRN

## 2024-05-15 MED ORDER — OXYCODONE HCL 5 MG PO TABS: 5.0000 mg | ORAL_TABLET | ORAL | Status: DC | PRN

## 2024-05-15 MED ORDER — METHYLERGONOVINE MALEATE 0.2 MG/ML IJ SOLN
INTRAMUSCULAR | Status: AC
  Filled 2024-05-15: qty 1

## 2024-05-15 MED ORDER — TETANUS-DIPHTH-ACELL PERTUSSIS 5-2.5-18.5 LF-MCG/0.5 IM SUSY: 0.5000 mL | PREFILLED_SYRINGE | Freq: Once | INTRAMUSCULAR | Status: DC

## 2024-05-15 MED ORDER — PHENYLEPHRINE 80 MCG/ML (10ML) SYRINGE FOR IV PUSH (FOR BLOOD PRESSURE SUPPORT): 80.0000 ug | PREFILLED_SYRINGE | INTRAVENOUS | Status: DC | PRN

## 2024-05-15 MED ORDER — FENTANYL-BUPIVACAINE-NACL 0.5-0.125-0.9 MG/250ML-% EP SOLN: 12.0000 mL/h | EPIDURAL | Status: DC | PRN

## 2024-05-15 MED ORDER — LACTATED RINGERS IV SOLN: 500.0000 mL | INTRAVENOUS | Status: DC | PRN

## 2024-05-15 MED ORDER — PHENYLEPHRINE 80 MCG/ML (10ML) SYRINGE FOR IV PUSH (FOR BLOOD PRESSURE SUPPORT)
80.0000 ug | PREFILLED_SYRINGE | INTRAVENOUS | Status: DC | PRN
  Filled 2024-05-15: qty 10

## 2024-05-15 MED ORDER — OXYCODONE-ACETAMINOPHEN 5-325 MG PO TABS: 1.0000 | ORAL_TABLET | ORAL | Status: DC | PRN

## 2024-05-15 MED ORDER — LACTATED RINGERS IV SOLN: 500.0000 mL | Freq: Once | INTRAVENOUS | Status: DC

## 2024-05-15 MED ORDER — LACTATED RINGERS IV SOLN
500.0000 mL | Freq: Once | INTRAVENOUS | Status: AC
  Administered 2024-05-15: 500 mL via INTRAVENOUS

## 2024-05-15 MED ORDER — ONDANSETRON HCL 4 MG/2ML IJ SOLN
4.0000 mg | Freq: Four times a day (QID) | INTRAMUSCULAR | Status: DC | PRN
  Administered 2024-05-15: 4 mg via INTRAVENOUS
  Filled 2024-05-15: qty 2

## 2024-05-15 MED ORDER — METHYLERGONOVINE MALEATE 0.2 MG PO TABS
0.2000 mg | ORAL_TABLET | Freq: Four times a day (QID) | ORAL | Status: AC
  Administered 2024-05-15 – 2024-05-16 (×5): 0.2 mg via ORAL
  Filled 2024-05-15 (×5): qty 1

## 2024-05-15 MED ORDER — COCONUT OIL OIL: 1.0000 | TOPICAL_OIL | Status: DC | PRN

## 2024-05-15 MED ORDER — HYDROXYZINE HCL 50 MG PO TABS: 50.0000 mg | ORAL_TABLET | Freq: Four times a day (QID) | ORAL | Status: DC | PRN

## 2024-05-15 MED ORDER — PENICILLIN G POT IN DEXTROSE 60000 UNIT/ML IV SOLN: 3.0000 10*6.[IU] | INTRAVENOUS | Status: DC

## 2024-05-15 MED ORDER — PRENATAL MULTIVITAMIN CH
1.0000 | ORAL_TABLET | Freq: Every day | ORAL | Status: DC
  Administered 2024-05-16 – 2024-05-17 (×2): 1 via ORAL
  Filled 2024-05-15 (×2): qty 1

## 2024-05-15 MED ORDER — FENTANYL-BUPIVACAINE-NACL 0.5-0.125-0.9 MG/250ML-% EP SOLN
12.0000 mL/h | EPIDURAL | Status: DC | PRN
  Administered 2024-05-15: 12 mL/h via EPIDURAL
  Filled 2024-05-15: qty 250

## 2024-05-15 MED ORDER — DIPHENHYDRAMINE HCL 50 MG/ML IJ SOLN
12.5000 mg | INTRAMUSCULAR | Status: DC | PRN
  Filled 2024-05-15: qty 1

## 2024-05-15 MED ORDER — METHYLERGONOVINE MALEATE 0.2 MG/ML IJ SOLN: 0.2000 mg | Freq: Four times a day (QID) | INTRAMUSCULAR | Status: AC

## 2024-05-15 MED ORDER — TRANEXAMIC ACID-NACL 1000-0.7 MG/100ML-% IV SOLN
1000.0000 mg | INTRAVENOUS | Status: AC
  Administered 2024-05-15: 1000 mg via INTRAVENOUS

## 2024-05-15 MED ORDER — OXYTOCIN-SODIUM CHLORIDE 30-0.9 UT/500ML-% IV SOLN
2.5000 [IU]/h | INTRAVENOUS | Status: DC
  Administered 2024-05-15: 2.5 [IU]/h via INTRAVENOUS
  Filled 2024-05-15: qty 500

## 2024-05-15 MED ORDER — OXYTOCIN BOLUS FROM INFUSION
333.0000 mL | Freq: Once | INTRAVENOUS | Status: AC
  Administered 2024-05-15: 333 mL via INTRAVENOUS

## 2024-05-15 MED ORDER — ONDANSETRON HCL 4 MG/2ML IJ SOLN: 4.0000 mg | INTRAMUSCULAR | Status: DC | PRN

## 2024-05-15 MED ORDER — PENICILLIN G POTASSIUM 5000000 UNITS IJ SOLR
5.0000 10*6.[IU] | Freq: Once | INTRAMUSCULAR | Status: AC
  Administered 2024-05-15: 5 10*6.[IU] via INTRAVENOUS
  Filled 2024-05-15: qty 5

## 2024-05-15 MED ORDER — WITCH HAZEL-GLYCERIN EX PADS: 1.0000 | MEDICATED_PAD | CUTANEOUS | Status: DC | PRN

## 2024-05-15 MED ORDER — LACTATED RINGERS IV SOLN: INTRAVENOUS | Status: DC

## 2024-05-15 MED ORDER — MISOPROSTOL 200 MCG PO TABS
800.0000 ug | ORAL_TABLET | Freq: Once | ORAL | Status: AC
  Administered 2024-05-15: 800 ug via RECTAL

## 2024-05-15 MED ORDER — OXYCODONE-ACETAMINOPHEN 5-325 MG PO TABS: 2.0000 | ORAL_TABLET | ORAL | Status: DC | PRN

## 2024-05-15 MED ORDER — FENTANYL CITRATE (PF) 100 MCG/2ML IJ SOLN: 50.0000 ug | INTRAMUSCULAR | Status: DC | PRN

## 2024-05-15 MED ORDER — LACTATED RINGERS IV BOLUS
1000.0000 mL | Freq: Once | INTRAVENOUS | Status: DC
Start: 2024-05-15 — End: 2024-05-17

## 2024-05-15 MED ORDER — DIPHENHYDRAMINE HCL 25 MG PO CAPS: 25.0000 mg | ORAL_CAPSULE | Freq: Four times a day (QID) | ORAL | Status: DC | PRN

## 2024-05-15 MED ORDER — MEASLES, MUMPS & RUBELLA VAC IJ SOLR: 0.5000 mL | Freq: Once | INTRAMUSCULAR | Status: DC

## 2024-05-15 MED ORDER — LIDOCAINE HCL (PF) 1 % IJ SOLN
INTRAMUSCULAR | Status: DC | PRN
Start: 2024-05-15 — End: 2024-05-15
  Administered 2024-05-15: 8 mL via EPIDURAL

## 2024-05-15 MED ORDER — TRANEXAMIC ACID-NACL 1000-0.7 MG/100ML-% IV SOLN
INTRAVENOUS | Status: AC
  Filled 2024-05-15: qty 100

## 2024-05-15 MED ORDER — SOD CITRATE-CITRIC ACID 500-334 MG/5ML PO SOLN: 30.0000 mL | ORAL | Status: DC | PRN

## 2024-05-15 MED ORDER — BENZOCAINE-MENTHOL 20-0.5 % EX AERO
1.0000 | INHALATION_SPRAY | CUTANEOUS | Status: DC | PRN
  Administered 2024-05-15: 1 via TOPICAL
  Filled 2024-05-15: qty 56

## 2024-05-15 MED ORDER — DIBUCAINE (PERIANAL) 1 % EX OINT: 1.0000 | TOPICAL_OINTMENT | CUTANEOUS | Status: DC | PRN

## 2024-05-15 MED ORDER — SIMETHICONE 80 MG PO CHEW
80.0000 mg | CHEWABLE_TABLET | ORAL | Status: DC | PRN
Start: 2024-05-15 — End: 2024-05-17

## 2024-05-15 MED ORDER — IBUPROFEN 600 MG PO TABS
600.0000 mg | ORAL_TABLET | Freq: Four times a day (QID) | ORAL | Status: DC
  Administered 2024-05-15 – 2024-05-17 (×7): 600 mg via ORAL
  Filled 2024-05-15 (×8): qty 1

## 2024-05-15 MED ORDER — METHYLERGONOVINE MALEATE 0.2 MG/ML IJ SOLN
0.2000 mg | Freq: Once | INTRAMUSCULAR | Status: AC
  Administered 2024-05-15: 0.2 mg via INTRAMUSCULAR

## 2024-05-15 MED ORDER — DIPHENHYDRAMINE HCL 50 MG/ML IJ SOLN
12.5000 mg | INTRAMUSCULAR | Status: DC | PRN
  Administered 2024-05-15: 12.5 mg via INTRAVENOUS

## 2024-05-15 MED ORDER — ONDANSETRON HCL 4 MG PO TABS: 4.0000 mg | ORAL_TABLET | ORAL | Status: DC | PRN

## 2024-05-15 MED ORDER — ACETAMINOPHEN 325 MG PO TABS
650.0000 mg | ORAL_TABLET | ORAL | Status: DC | PRN
  Administered 2024-05-16: 650 mg via ORAL
  Filled 2024-05-15: qty 2

## 2024-05-15 MED ORDER — LIDOCAINE HCL (PF) 1 % IJ SOLN: 30.0000 mL | INTRAMUSCULAR | Status: DC | PRN

## 2024-05-15 MED ORDER — LACTATED RINGERS IV SOLN: INTRAVENOUS | Status: AC

## 2024-05-15 NOTE — MAU Note (Signed)
..  Lacey Ford is a 38 y.o. at [redacted]w[redacted]d here in MAU reporting: contractions every Denies vaginal bleeding or leaking of fluid. +FM  Patient brought directly to room d/t intensity of contractions.  SVE: Nydia Belfast, RN 9cm  Pain score: 10/10  FHT:150 Lab orders placed from triage: MAU labor

## 2024-05-15 NOTE — Progress Notes (Signed)
 LABOR PROGRESS NOTE  Lacey Ford is a 38 y.o. [id number] at [redacted]w[redacted]d  admitted for spontaneous labor.  Subjective: Feeling super comfortable with epidural  Objective: BP 125/85 (BP Location: Left Arm)   Pulse 99   Temp 98.3 F (36.8 C) (Oral)   Resp 18   SpO2 100%  or  Vitals:   05/15/24 0831 05/15/24 0836 05/15/24 0848 05/15/24 0900  BP: 122/75  123/79 125/85  Pulse: 97  94 99  Resp:    18  Temp:    98.3 F (36.8 C)  TempSrc:    Oral  SpO2: 100% 100%      Physical Exam Vitals reviewed.  Constitutional:      General: She is not in acute distress.    Appearance: She is well-developed. She is not diaphoretic.   Cardiovascular:     Rate and Rhythm: Normal rate and regular rhythm.     Heart sounds: Normal heart sounds. No murmur heard. Pulmonary:     Effort: Pulmonary effort is normal. No respiratory distress.     Breath sounds: Normal breath sounds. No wheezing or rales.  Abdominal:     General: Bowel sounds are normal. There is no distension.     Palpations: Abdomen is soft.     Tenderness: There is no abdominal tenderness. There is no guarding or rebound.   Skin:    General: Skin is warm and dry.   Neurological:     Mental Status: She is alert.     Coordination: Coordination normal.     Dilation: 9 Effacement (%): 100 Station: 0 Presentation: Vertex Exam by:: R Kappert RN  FHT Baseline: 130 bpm Variability: Good {> 6 bpm) Accelerations: Reactive Decelerations: Absent Uterine activity: irregular Cat: I  Labs: Lab Results  Component Value Date   WBC 10.3 05/15/2024   HGB 10.0 (L) 05/15/2024   HCT 31.6 (L) 05/15/2024   MCV 82.9 05/15/2024   PLT 197 05/15/2024    Patient Active Problem List   Diagnosis Date Noted   Preterm labor 05/15/2024   Low lying placenta nos or without hemorrhage, third trimester 08/10/2022   Delivery of pregnancy by cesarean section 08/10/2022   Limited prenatal care 08/10/2022   Drug use affecting pregnancy 08/10/2022    Supervision of high risk pregnancy, antepartum 06/27/2022   Unwanted fertility 06/27/2022   Placenta previa 05/16/2022   Preterm delivery, delivered 05/30/2019   Gestational hypertension 05/30/2019    Assessment / Plan: 38 y.o. [id number] at [redacted]w[redacted]d here for SOL.  Labor: AROM for clear at 0930. Expectant management for now.  Fetal Wellbeing:  Cat I Pain Control:  epidural, working well GBS: neg on 05/04/2024 Anticipated MOD:  VBAC  Scant PNC: patient verbally consented to UDS earlier, has history of primarily stimulant use disorder. Will discuss [facility] clinic with her postpartum and also get TOC consult.   Unwanted fertility: desires BTL but no papers signed this pregnancy, offered IUD but declined, planning on nexplanon.   Teena Feast, MD/MPH Attending Family Medicine Physician, [facility] for [facility], [facility]   05/15/2024, 9:34 AM

## 2024-05-15 NOTE — Discharge Summary (Signed)
 Postpartum Discharge Summary  Date of Service updated***     Patient Name: Lacey Ford DOB: [birth year] MRN: [id number]  Date of admission: 05/15/2024 Delivery date:05/15/2024 Delivering provider: Teena Feast Date of discharge: 05/15/2024  Admitting diagnosis: Preterm labor [O60.00] Intrauterine pregnancy: [redacted]w[redacted]d     Secondary diagnosis:  Active Problems:   Preterm delivery, delivered   History of gestational hypertension   Unwanted fertility   History of cesarean delivery   Preterm labor   Postpartum hemorrhage  Additional problems: ***    Discharge diagnosis: Preterm Pregnancy Delivered, VBAC, and PPH                                              Post partum procedures:JADA placement, *** Augmentation: AROM Complications: Hemorrhage>1019mL  Hospital course: Onset of Labor With Vaginal Delivery      38 y.o. yo [id number] at [redacted]w[redacted]d was admitted in Active Labor on 05/15/2024. Labor course was complicated by: arrived at 8 cm, augmented with AROM, shortly after was 10 cm and had uncomplicated VBAC.   Membrane Rupture Time/Date: 9:28 AM,05/15/2024  Delivery Method:Vaginal, Spontaneous Operative Delivery:N/A Episiotomy:   Lacerations:    Patient had a postpartum course complicated by postpartum hemorrhage requiring TXA, methergine , misoprostol , and JADA placement. PPD#1 CBC was ***.  She is ambulating, tolerating a regular diet, passing flatus, and urinating well. Patient is discharged home in stable condition on 05/15/24.  Newborn Data: Birth date:05/15/2024 Birth time:12:07 PM Gender:Female Living status:Living Apgars:9 ,9  Weight:3340 g  Magnesium  Sulfate received: No BMZ received: No Rhophylac:N/A MMR:{MMR:30440033} T-DaP:{Tdap:23962} Flu: N/A RSV Vaccine received: No Transfusion:{Transfusion received:30440034}  Immunizations received: Immunization History  Administered Date(s) Administered   MMR 08/11/2022   Tdap 06/27/2022    Physical exam  Vitals:    05/15/24 0900 05/15/24 1030 05/15/24 1100 05/15/24 1130  BP: 125/85 113/62 128/79 119/70  Pulse: 99 91 96 88  Resp: 18   18  Temp: 98.3 F (36.8 C)   98 F (36.7 C)  TempSrc: Oral   Oral  SpO2:       General: {Exam; general:21111117} Lochia: {Desc; appropriate/inappropriate:30686::appropriate} Uterine Fundus: {Desc; firm/soft:30687} Incision: {Exam; incision:21111123} DVT Evaluation: {Exam; dvt:2111122} Labs: Lab Results  Component Value Date   WBC 10.3 05/15/2024   HGB 10.0 (L) 05/15/2024   HCT 31.6 (L) 05/15/2024   MCV 82.9 05/15/2024   PLT 197 05/15/2024      Latest Ref Rng & Units 05/04/2024    2:02 PM  CMP  Glucose 70 - 99 mg/dL 99   BUN 6 - 20 mg/dL 5   Creatinine 1.47 - 8.29 mg/dL 5.62   Sodium 130 - 865 mmol/L 134   Potassium 3.5 - 5.1 mmol/L 3.4   Chloride 98 - 111 mmol/L 103   CO2 22 - 32 mmol/L 20   Calcium  8.9 - 10.3 mg/dL 8.9   Total Protein 6.5 - 8.1 g/dL 6.0   Total Bilirubin 0.0 - 1.2 mg/dL 0.3   Alkaline Phos 38 - 126 U/L 95   AST 15 - 41 U/L 20   ALT 0 - 44 U/L 14    Edinburgh Score:    08/10/2022   10:45 PM  Edinburgh Postnatal Depression Scale Screening Tool  I have been able to laugh and see the funny side of things. 0  I have looked forward with enjoyment to things. 0  I have blamed  myself unnecessarily when things went wrong. 1  I have been anxious or worried for no good reason. 2  I have felt scared or panicky for no good reason. 1  Things have been getting on top of me. 0  I have been so unhappy that I have had difficulty sleeping. 0  I have felt sad or miserable. 2  I have been so unhappy that I have been crying. 1  The thought of harming myself has occurred to me. 0  Edinburgh Postnatal Depression Scale Total 7      Data saved with a previous flowsheet row definition   No data recorded  After visit meds:  Allergies as of 05/15/2024   No Known Allergies   Med Rec must be completed prior to using this  [facility]***        Discharge home in stable condition Infant Feeding: Bottle Infant Disposition:TBD Discharge instruction: per After Visit Summary and Postpartum booklet. Activity: Advance as tolerated. Pelvic rest for 6 weeks.  Diet: routine diet Future Appointments:No future appointments. Follow up Visit:   Please schedule this patient for a In person postpartum visit in 4 weeks with the following provider: [facility]. Additional Postpartum F/U:Nexplanon if not done inpatient  High risk pregnancy complicated by: postpartum hemorrhage, no prenatal care Delivery mode:  Vaginal, Spontaneous Anticipated Birth Control:  Planning inpatient nexplanon   05/15/2024 Teena Feast, MD

## 2024-05-15 NOTE — Anesthesia Procedure Notes (Signed)
 Epidural Patient location during procedure: OB Start time: 05/15/2024 7:42 AM End time: 05/15/2024 7:47 AM  Staffing Anesthesiologist: Rhenda Cedars, MD  Preanesthetic Checklist Completed: patient identified, IV checked, site marked, risks and benefits discussed, surgical consent, monitors and equipment checked, pre-op evaluation and timeout performed  Epidural Patient position: sitting Prep: DuraPrep and site prepped and draped Patient monitoring: continuous pulse ox and blood pressure Approach: midline Location: L4-L5 Injection technique: LOR air  Needle:  Needle type: Tuohy  Needle gauge: 17 G Needle length: 9 cm and 9 Needle insertion depth: 5 cm Catheter type: closed end flexible Catheter size: 19 Gauge Catheter at skin depth: 11 cm Test dose: negative  Assessment Events: blood not aspirated, no cerebrospinal fluid, injection not painful, no injection resistance, no paresthesia and negative IV test

## 2024-05-15 NOTE — Anesthesia Preprocedure Evaluation (Signed)
 Anesthesia Evaluation  Patient identified by MRN, date of birth, ID band Patient awake    Reviewed: Allergy & Precautions, H&P , NPO status , Patient's Chart, lab work & pertinent test results, reviewed documented beta blocker date and time   Airway Mallampati: II  TM Distance: >3 FB Neck ROM: full    Dental no notable dental hx.    Pulmonary neg pulmonary ROS, Current Smoker   Pulmonary exam normal breath sounds clear to auscultation       Cardiovascular hypertension, negative cardio ROS Normal cardiovascular exam Rhythm:regular Rate:Normal     Neuro/Psych negative neurological ROS  negative psych ROS   GI/Hepatic negative GI ROS,,,(+)     substance abuse  IV drug use  Endo/Other  negative endocrine ROS    Renal/GU negative Renal ROS  negative genitourinary   Musculoskeletal   Abdominal   Peds  Hematology negative hematology ROS (+)   Anesthesia Other Findings   Reproductive/Obstetrics (+) Pregnancy                             Anesthesia Physical Anesthesia Plan  ASA: 3  Anesthesia Plan: Epidural   Post-op Pain Management: Minimal or no pain anticipated   Induction: Intravenous  PONV Risk Score and Plan: 1  Airway Management Planned:   Additional Equipment: Fetal Monitoring  Intra-op Plan:   Post-operative Plan:   Informed Consent: I have reviewed the patients History and Physical, chart, labs and discussed the procedure including the risks, benefits and alternatives for the proposed anesthesia with the patient or authorized representative who has indicated his/her understanding and acceptance.       Plan Discussed with: Anesthesiologist  Anesthesia Plan Comments:        Anesthesia Quick Evaluation

## 2024-05-15 NOTE — Anesthesia Postprocedure Evaluation (Signed)
 Anesthesia Post Note  Patient: Lacey Ford  Procedure(s) Performed: AN AD HOC LABOR EPIDURAL     Patient location during evaluation: Mother Baby Anesthesia Type: Epidural Level of consciousness: awake and alert Pain management: pain level controlled Vital Signs Assessment: post-procedure vital signs reviewed and stable Respiratory status: spontaneous breathing, nonlabored ventilation and respiratory function stable Cardiovascular status: stable Postop Assessment: no headache, no backache and epidural receding Anesthetic complications: no   No notable events documented.  Last Vitals:  Vitals:   05/15/24 1415 05/15/24 1530  BP: 121/71 127/67  Pulse: 95 96  Resp: 20 20  Temp: 36.7 C   SpO2: 100% 100%    Last Pain:  Vitals:   05/15/24 1530  TempSrc:   PainSc: 0-No pain   Pain Goal: Patients Stated Pain Goal: 0 (05/15/24 0732)                 Rosaland Collie

## 2024-05-15 NOTE — H&P (Addendum)
 OBSTETRIC ADMISSION HISTORY AND PHYSICAL  Lacey Ford is a 38 y.o. female [phone] with IUP at [redacted]w[redacted]d by third trimester ultrasound presenting for SOL. She reports +FMs, No LOF, no VB, no blurry vision, headaches or peripheral edema, and RUQ pain.   She received her prenatal care at: she did not receive prenatal care  Dating: By third trimester ultrasound --->  Estimated Date of Delivery: 06/06/24  Sono:    @[redacted]w[redacted]d , CWD, normal anatomy, cephalic presentation, posterior placental lie, 2725g, 58% EFW   Prenatal History/Complications: no prenatal care   Past Medical History: Past Medical History:  Diagnosis Date   Preeclampsia 05/30/2019    Past Surgical History: Past Surgical History:  Procedure Laterality Date   CESAREAN SECTION N/A 08/10/2022   Procedure: CESAREAN SECTION;  Surgeon: Abigail Abler, MD;  Location: [facility];  Service: Obstetrics;  Laterality: N/A;    Obstetrical History: OB History     Gravida  8   Para  4   Term  3   Preterm  1   AB  3   Living  4      SAB  2   IAB  1   Ectopic      Multiple  0   Live Births  4           Social History Social History   Socioeconomic History   Marital status: Single    Spouse name: Not on file   Number of children: Not on file   Years of education: Not on file   Highest education level: Not on file  Occupational History   Not on file  Tobacco Use   Smoking status: Every Day    Current packs/day: 0.50    Average packs/day: 0.5 packs/day for 19.5 years (9.7 ttl pk-yrs)    Types: Cigarettes    Start date: 2006   Smokeless tobacco: Never  Substance and Sexual Activity   Alcohol use: Not Currently    Comment: Occasional   Drug use: Yes    Types: Marijuana, Cocaine, Methamphetamines    Comment: smoked a couple blunts; has used cocaine and meth in the past but not since she found out she was pregnant   Sexual activity: Yes  Other Topics Concern   Not on file  Social History Narrative   Not  on file   Social Drivers of Health   Financial Resource Strain: Not on file  Food Insecurity: Not on file  Transportation Needs: Not on file  Physical Activity: Not on file  Stress: Not on file  Social Connections: Not on file    Family History: Family History  Problem Relation Age of Onset   Epilepsy Mother     Allergies: No Known Allergies  Medications Prior to Admission  Medication Sig Dispense Refill Last Dose/Taking   acetaminophen  (TYLENOL ) 325 MG tablet Take 2 tablets (650 mg total) by mouth every 4 (four) hours as needed for fever (for pain scale < 4  OR  temperature  >/=  100.5 F).      cyclobenzaprine  (FLEXERIL ) 10 MG tablet Take 1 tablet (10 mg total) by mouth 2 (two) times daily as needed for muscle spasms. 20 tablet 0    Multiple Vitamin (MULTIVITAMIN) LIQD Take 5 mLs by mouth daily.        Review of Systems   All systems reviewed and negative except as stated in HPI  unknown if currently breastfeeding. General appearance: alert and cooperative Lungs: clear to auscultation bilaterally Heart:  regular rate and rhythm Abdomen: soft, non-tender; bowel sounds normal Pelvic: adequate Extremities: Homans sign is negative, no sign of DVT Presentation: cephalic Fetal monitoringBaseline: 150 bpm, Variability: Good {> 6 bpm), Accelerations: Reactive, and Decelerations: Absent Uterine activityFrequency: Every 3-4 minutes Dilation: 8 Effacement (%): 80 Station: 0 Exam by:: Broadus Canes, CNM   Prenatal labs: ABO, Rh: --/--/A POS (06/07 1358) Antibody: NEG (06/07 1358) Rubella: 1.25 (06/07 1402) RPR: NON REACTIVE (06/07 1402)  HBsAg: NON REACTIVE (06/07 1402)  HIV: Non Reactive (06/07 1402)  GBS:     No results found for: GBS GTT not done Genetic screening  not done Anatomy US  [redacted]w[redacted]d suboptimal good fetal movement and AFI   Immunization History  Administered Date(s) Administered   MMR 08/11/2022   Tdap 06/27/2022    Prenatal Transfer Tool  Maternal  Diabetes: not tested Genetic Screening: not tested Maternal Ultrasounds/Referrals: third trimester anatomy scan  Fetal Ultrasounds or other Referrals:  None Maternal Substance Abuse:  unknown Significant Maternal Medications:  None Significant Maternal Lab Results: GBS unknown  Number of Prenatal Visits:Less than or equal to 3 verified prenatal visits Maternal Vaccinations: none Other Comments:  None   No results found for this or any previous visit (from the past 24 hours).  Patient Active Problem List   Diagnosis Date Noted   Preterm labor 05/15/2024   Low lying placenta nos or without hemorrhage, third trimester 08/10/2022   Delivery of pregnancy by cesarean section 08/10/2022   Limited prenatal care 08/10/2022   Drug use affecting pregnancy 08/10/2022   Supervision of high risk pregnancy, antepartum 06/27/2022   Unwanted fertility 06/27/2022   Placenta previa 05/16/2022   Preterm delivery, delivered 05/30/2019   Gestational hypertension 05/30/2019    Assessment/Plan:  Lacey Ford is a 37 y.o. [id number] at [redacted]w[redacted]d here for SOL.  Patient would like to attempt TOLAC.  Patient has not been able to schedule an appointment during pregnancy and has not been able to receive care.    #Labor:active labor #Pain: Patient would like epidural if possible  #FWB: Cat 1 strip  #GBS status:  Unknown, started on penicillin   #Feeding: undecided #Reproductive Life planning: Undecided #Circ:  unsure #h/o prior c-section: obtained verbal and written consent for TOLAC   Lacey Keel MD [facility] Resident PGY-1  Center for [facility], [facility] 05/15/24 6:54 AM   Attestation:  I confirm that I have verified the information documented in the resident's note and that I have also personally reperformed the physical exam and all medical decision making activities.   The patient was seen and examined by me also Agree with note NST reactive and reassuring UCs  as listed Cervical exams as listed in note  Lacey Ford, CNM

## 2024-05-15 NOTE — Lactation Note (Signed)
 This note was copied from a baby's chart. Lactation Consultation Note  Patient Name: Lacey Ford HQION'G Date: 05/15/2024 Age:39 hours Reason for consult: Initial assessment  MOB is formula feeding only. Please let LC team know if MOB needs assistance at any time.  Feeding Mother's Current Feeding Choice: Formula Nipple Type: Slow - flow  Consult Status Consult Status: Complete Date: 05/15/24    Vernette Goo BS, IBCLC 05/15/2024, 3:30 PM

## 2024-05-16 ENCOUNTER — Other Ambulatory Visit: Payer: Self-pay

## 2024-05-16 LAB — CBC
HCT: 27.3 % — ABNORMAL LOW (ref 36.0–46.0)
Hemoglobin: 8.7 g/dL — ABNORMAL LOW (ref 12.0–15.0)
MCH: 26.5 pg (ref 26.0–34.0)
MCHC: 31.9 g/dL (ref 30.0–36.0)
MCV: 83.2 fL (ref 80.0–100.0)
Platelets: 214 10*3/uL (ref 150–400)
RBC: 3.28 MIL/uL — ABNORMAL LOW (ref 3.87–5.11)
RDW: 15.3 % (ref 11.5–15.5)
WBC: 11.9 10*3/uL — ABNORMAL HIGH (ref 4.0–10.5)
nRBC: 0 % (ref 0.0–0.2)

## 2024-05-16 MED ORDER — FERROUS SULFATE 325 (65 FE) MG PO TABS
325.0000 mg | ORAL_TABLET | ORAL | Status: DC
  Administered 2024-05-16: 325 mg via ORAL
  Filled 2024-05-16: qty 1

## 2024-05-16 NOTE — Clinical Social Work Maternal (Signed)
 CLINICAL SOCIAL WORK MATERNAL/CHILD NOTE  Patient Details  Name: Lacey Ford MRN: [id number] Date of Birth: October 14, 1986  Date:  05/16/2024  Clinical Social Worker Initiating Note:  Lacey Ford Date/Time: Initiated:  05/16/24/1558     Child's Name:  Lacey Ford   Biological Parents:  Mother Lacey Ford 06/14/1986)   Need for Interpreter:  None   Reason for Referral:  Late or No Prenatal Care  , Current Substance Use/Substance Use During Pregnancy     Address:  [address] [place] [address]    Phone number:  [phone] (home)     Additional phone number:   Household Members/Support Persons (HM/SP):   Household Member/Support Person 1, Household Member/Support Person 2   HM/SP Name Relationship DOB or Age  HM/SP -1 Lacey Ford friend    HM/SP -2 Lacey Ford child 07/2022  HM/SP -3        HM/SP -4        HM/SP -5        HM/SP -6        HM/SP -7        HM/SP -8          Natural Supports (not living in the home):  Children   Professional Supports: None   Employment: Unemployed   Type of Work:     Education:  9 to 11 years   Homebound arranged: No  Financial Resources:  Self-Pay     Other Resources:  Archivist Considerations Which May Impact Care:    Strengths:  Ability to meet basic needs  , Home prepared for child     Psychotropic Medications:         Pediatrician:       Pediatrician List:   [employer]    [place]    [facility]      Pediatrician Fax Number:    Risk Factors/Current Problems:  Substance Use  , DHHS Involvement     Cognitive State:  Able to Concentrate     Mood/Affect:  Blunted  , Flat     CSW Assessment: CSW received a consult for lack of prenatal care. CSW entered the room and observed MOB lying in bed and the infant in the bassinet. CSW introduced self, CSW role and reason for visit, MOB was agreeable to visit. CSW inquired about  how MOB was feeling, MOB reported feeling tired. MOB was calm and cooperative during the assessment. CSW confirmed MOB's address and phone number, MOB verified the information on file was correct. MOB reported she lives with her friend Lacey Ford and son Lacey Ford (18 months). CSW inquired about and MH concerns, MOB denied MH concerns CSW provided education regarding the baby blues period vs. perinatal mood disorders, discussed treatment and gave resources for mental health follow up if concerns arise.  CSW recommends self-evaluation during the postpartum time period using the New Mom Checklist from Postpartum Progress and encouraged MOB to contact a medical professional if symptoms are noted at any time. MOB identified her had and daughter as her primary supports.    CSW inquired about MOB lack of prenatal care, MOB reported she she reached out the office said there were no available appointment  ans she also had trouble getting her pregnancy medicaid. CSW informed MOB due to lack of [facility] a hospital drug screen would be performed, MOB verbalized understanding. CSW inquired about  substance use during pregnancy, MOB reported she used CBD and smoked cigarettes (1 pack a day). CSW informed MOB infants UDS was positive for Amphetamines as well has her UDS. MOB looked confused and stated I don't know how, that's not right. CSW explained Amphetamines was the only positive substance noted in the urine. CSW informed MOB a CPS report would be made to [facility] due to infant UDS results, MOB verbalized understanding and denied having any questions. CSW inquired about CPS case with her son Lacey Ford, [place] reported the case has been closed and she has custody of him. CSW made CPS report to [facility].    CSW provided review of Sudden Infant Death Syndrome (SIDS) precautions.  MOB has not identified a pediatric follow up at this time. MOB reported she has all necessary items for the infant including a bassinet and  car seat. Barriers to infants discharge at this time  CSW Plan/Description:  No Further Intervention Required/No Barriers to Discharge, Sudden Infant Death Syndrome (SIDS) Education, Perinatal Mood and Anxiety Disorder (PMADs) Education, Hospital Drug Screen Policy Information, Other Information/Referral to [facility], Neonatal Abstinence Syndrome (NAS) Education, CSW Will Continue to Monitor Umbilical Cord Tissue Drug Screen Results and Make Report if Warranted, Child Protective Service Report  , CSW Awaiting CPS Disposition Plan    Lacey Glasser, LCSW 05/16/2024, 4:05 PM

## 2024-05-16 NOTE — Social Work (Signed)
 [facility] CPS SW Welford Haley [phone] completed an assessment with MOB. Case to be transferred to a new SW on Friday. Please notify Welford Haley if MOB leaves AMA with the infant. Barriers to infants discharge  Berma Harts, [facility] Clinical Social Worker [phone]

## 2024-05-16 NOTE — Progress Notes (Signed)
 Post Partum Day 1 Subjective:  Lacey Ford is a 38 y.o. [id number] [redacted]w[redacted]d s/p svd.  No acute events overnight.  Not in pain. Eating and drinking well. Patient is somnolent and falling asleep.   Plan for birth control is nexplanon.  Method of Feeding: both  Objective: Blood pressure 134/77, pulse 79, temperature 97.6 F (36.4 C), temperature source Axillary, resp. rate 18, SpO2 100%, unknown if currently breastfeeding.  Physical Exam:  General: alert, cooperative and no distress Lochia:normal flow Chest: normal WOB Heart: Regular rate Uterine Fundus: firm, DVT Evaluation: No evidence of DVT seen on physical exam.   Recent Labs    05/15/24 1343 05/16/24 0610  HGB 9.5* 8.7*  HCT 30.1* 27.3*    Assessment/Plan:  ASSESSMENT: Lacey Ford is a 38 y.o. [id number] [redacted]w[redacted]d s/p svd  Plan for discharge tomorrow Continue routine PP care Breastfeeding support PRN  Attempted to consent for Nexplanon but patient was falling asleep. Appeared stable otherwise. Given inability to appropriately consent we will defer to tomorrow  PPH: Anemia, acute blood loss on chronic anemia. Will start PO iron .   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    LOS: 1 day   Abner Ables 05/16/2024, 4:46 PM

## 2024-05-17 ENCOUNTER — Other Ambulatory Visit (HOSPITAL_COMMUNITY): Payer: Self-pay

## 2024-05-17 DIAGNOSIS — O34219 Maternal care for unspecified type scar from previous cesarean delivery: Principal | ICD-10-CM | POA: Diagnosis not present

## 2024-05-17 MED ORDER — MEASLES, MUMPS & RUBELLA VAC IJ SOLR: 0.5000 mL | Freq: Once | INTRAMUSCULAR | Status: DC

## 2024-05-17 MED ORDER — IBUPROFEN 600 MG PO TABS
600.0000 mg | ORAL_TABLET | Freq: Four times a day (QID) | ORAL | 1 refills | Status: AC
  Filled 2024-05-17: qty 30, 8d supply, fill #0

## 2024-05-17 MED ORDER — ACETAMINOPHEN 500 MG PO TABS
1000.0000 mg | ORAL_TABLET | Freq: Four times a day (QID) | ORAL | 0 refills | Status: AC | PRN
  Filled 2024-05-17: qty 60, 8d supply, fill #0

## 2024-05-17 NOTE — Social Work (Signed)
 CSW made aware that CPS SW assigned is [employer] [phone]. CPS SW notified CSW a home assessment must be completed to verify MOB's living conditions as well as make sure MOB has appropriate supplies for the infant. MOB is aware the infant must remain in the hospital until barriers have been addressed and CPS notifies CSW. Barriers to infants discharge remain at this time.   Jakari Sada, LCSWA Clinical Social Worker [phone]

## 2024-05-20 ENCOUNTER — Encounter (HOSPITAL_COMMUNITY): Payer: Self-pay | Admitting: Obstetrics and Gynecology

## 2024-05-23 ENCOUNTER — Telehealth (HOSPITAL_COMMUNITY): Payer: Self-pay | Admitting: *Deleted

## 2024-05-23 NOTE — Telephone Encounter (Signed)
 05/23/2024  Name: Lacey Ford MRN: [id number] DOB: 02-Oct-1986  Reason for Call:  Transition of Care Hospital Discharge Call  Contact Status: Patient Contact Status: Unable to contact (voice mail box full, unable to leave a message)  Language assistant needed:          Follow-Up Questions:    [place] Postnatal Depression Scale:  In the Past 7 Days:    PHQ2-9 Depression Scale:     Discharge Follow-up:    Post-discharge interventions: NA  Mliss Sieve, RN 05/23/2024 12:58

## 2024-06-11 ENCOUNTER — Ambulatory Visit: Payer: Self-pay | Admitting: Family Medicine

## 2024-06-25 ENCOUNTER — Telehealth: Payer: Self-pay | Admitting: *Deleted

## 2024-06-25 ENCOUNTER — Telehealth: Payer: Self-pay | Admitting: Family Medicine

## 2024-06-25 NOTE — Telephone Encounter (Signed)
 Called patient's number on file to make sure she received the text link we sent for her virtual appointment.  Patient did not answer but mailbox was full.  Attempted to call twice.
